# Patient Record
Sex: Male | Born: 1990 | Race: Black or African American | Hispanic: No | Smoking: Current every day smoker
Health system: Southern US, Community
[De-identification: ages and names within clinical notes are randomized; demographics above are authoritative.]

---
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
## 2008-03-28 ENCOUNTER — Emergency Department (HOSPITAL_COMMUNITY): Admission: EM | Admit: 2008-03-28 | Discharge: 2008-03-28 | Payer: Self-pay | Admitting: Emergency Medicine
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
## 2011-08-06 LAB — URINALYSIS, ROUTINE W REFLEX MICROSCOPIC
Hgb urine dipstick: NEGATIVE
Specific Gravity, Urine: 1.019
pH: 7.5

## 2011-08-06 LAB — CBC
MCHC: 33.3
MCV: 84.9
Platelets: 196
RDW: 13.9

## 2011-08-06 LAB — DIFFERENTIAL
Basophils Absolute: 0
Eosinophils Absolute: 0.1
Eosinophils Relative: 1
Lymphocytes Relative: 21
Lymphs Abs: 1.9
Monocytes Absolute: 0.9
Neutro Abs: 6.3

## 2011-08-06 LAB — URINE MICROSCOPIC-ADD ON

## 2011-08-06 LAB — HEPATIC FUNCTION PANEL
ALT: 12
AST: 18
Albumin: 4.1
Alkaline Phosphatase: 73
Bilirubin, Direct: 0.1
Total Protein: 6.6

## 2011-08-06 LAB — I-STAT 8, (EC8 V) (CONVERTED LAB)
Acid-base deficit: 1
BUN: 10
Bicarbonate: 23.3
Chloride: 107
HCT: 46
Sodium: 139

## 2015-01-04 ENCOUNTER — Encounter (HOSPITAL_COMMUNITY): Payer: Self-pay | Admitting: *Deleted

## 2015-01-04 ENCOUNTER — Emergency Department (HOSPITAL_COMMUNITY)
Admission: EM | Admit: 2015-01-04 | Discharge: 2015-01-04 | Disposition: A | Discharge status: Home / Self Care | Payer: Self-pay | Attending: Emergency Medicine | Admitting: Emergency Medicine

## 2015-01-04 DIAGNOSIS — S61219A Laceration without foreign body of unspecified finger without damage to nail, initial encounter: Secondary | ICD-10-CM

## 2015-01-04 DIAGNOSIS — W260XXA Contact with knife, initial encounter: Secondary | ICD-10-CM | POA: Insufficient documentation

## 2015-01-04 DIAGNOSIS — Y998 Other external cause status: Secondary | ICD-10-CM | POA: Insufficient documentation

## 2015-01-04 DIAGNOSIS — Y9289 Other specified places as the place of occurrence of the external cause: Secondary | ICD-10-CM | POA: Insufficient documentation

## 2015-01-04 DIAGNOSIS — Y9389 Activity, other specified: Secondary | ICD-10-CM | POA: Insufficient documentation

## 2015-01-04 DIAGNOSIS — S61215A Laceration without foreign body of left ring finger without damage to nail, initial encounter: Secondary | ICD-10-CM | POA: Insufficient documentation

## 2015-01-04 DIAGNOSIS — S61217A Laceration without foreign body of left little finger without damage to nail, initial encounter: Secondary | ICD-10-CM | POA: Insufficient documentation

## 2015-01-04 MED ORDER — TRAMADOL HCL 50 MG PO TABS: 50.0000 mg | ORAL_TABLET | Freq: Four times a day (QID) | ORAL | Status: DC | PRN

## 2015-01-04 MED ORDER — NAPROXEN 250 MG PO TABS
500.0000 mg | ORAL_TABLET | Freq: Once | ORAL | Status: AC
Start: 2015-01-04 — End: 2015-01-04
  Administered 2015-01-04: 500 mg via ORAL
  Filled 2015-01-04: qty 2

## 2015-01-04 NOTE — ED Notes (Signed)
Declined W/C at D/C and was escorted to lobby by RN. 

## 2015-01-04 NOTE — ED Notes (Signed)
Lat ring and little finger laceratiions from 1800 yesterday.   He did with a knife cutting produce

## 2015-01-04 NOTE — ED Provider Notes (Signed)
CSN: [id number]     Arrival date & time 01/04/15  1726 History  This chart was scribed for a non-physician practitioner, Eben Burowourtney A Forcucci, PA-C working with Elwin MochaBlair Walden, MD by [place] Peace, ED Scribe. The patient was seen in TR09C/TR09C. The patient's care was started at 6:27 PM.     Chief Complaint  Patient presents with  . Laceration      Patient is a 24 y.o. male presenting with skin laceration. The history is provided by the patient. No language interpreter was used.  Laceration Location:  Hand Hand laceration location: Left little finger and left ring finger. Depth:  Cutaneous Bleeding: controlled   Laceration mechanism:  Knife Pain details:    Severity:  Severe Tetanus status:  Up to date HPI Comments: Timothy Patrick is a 24 y.o. male who presents to the Emergency Department complaining of laceration to palmar aspect of left little finger and left ring finger that occurred last night while pt was cutting up vegetables. He explains that he did not come to ED last night because he wasn't in much pain but adds that when he woke up this morning, affected fingers were extremely stiff. Pt is able to move fingers but states it is difficult for him due to pain. Pt is right hand dominant. No complaints of fever, chills, nausea, or vomiting. Pt is current everyday smoker.    History reviewed. No pertinent past medical history. History reviewed. No pertinent past surgical history. No family history on file. History  Substance Use Topics  . Smoking status: Current Every Day Smoker  . Smokeless tobacco: Not on file  . Alcohol Use: Yes    Review of Systems  Constitutional: Negative for fever and chills.  Gastrointestinal: Negative for nausea and vomiting.  Skin: Positive for wound.       Laceration to left little finger and left ring finger.   All other systems reviewed and are negative.     Allergies  Review of patient's allergies indicates no known allergies.  Home  Medications   Prior to Admission medications   Medication Sig Start Date End Date Taking? Authorizing Provider  traMADol (ULTRAM) 50 MG tablet Take 1 tablet (50 mg total) by mouth every 6 (six) hours as needed. 01/04/15   Caleen Taaffe A Forcucci, PA-C   BP 134/61 mmHg  Pulse 78  Temp(Src) 98.5 F (36.9 C) (Oral)  Resp 16  SpO2 100% Physical Exam  Constitutional: He is oriented to person, place, and time. He appears well-developed and well-nourished. No distress.  HENT:  Head: Normocephalic and atraumatic.  Eyes: Conjunctivae and EOM are normal.  Neck: Neck supple. No tracheal deviation present.  Cardiovascular: Normal rate and regular rhythm.  Exam reveals no gallop and no friction rub.   No murmur heard. Pulmonary/Chest: Effort normal and breath sounds normal. No respiratory distress. He has no wheezes. He has no rales.  Musculoskeletal: Normal range of motion.       Left hand: He exhibits tenderness and laceration. He exhibits normal range of motion, no bony tenderness, normal two-point discrimination, normal capillary refill, no deformity and no swelling. Normal sensation noted. Normal strength noted.       Hands: Neurological: He is alert and oriented to person, place, and time.  Skin: Skin is warm and dry.  Psychiatric: He has a normal mood and affect. His behavior is normal.  Nursing note and vitals reviewed.   ED Course  Procedures (including critical care time) Labs Review Labs Reviewed - No  data to display  Imaging Review No results found.   EKG Interpretation None     Medications - No data to display  6:32 PM- Treatment plan was discussed with patient who verbalizes understanding and agrees.   MDM   Final diagnoses:  Finger laceration, initial encounter   Vision the 30 her old male who presents emergency room for evaluation of lacerations to the left hand. Patient's right hand dominant. Lacerations were soaked and cleaned here. Lacerations occurred more than 24  hours ago. I will not close these lacerations with sutures. Ears strips have been placed to hold them together loosely. Patient's up-to-date on his tetanus. He has full active flexion at the DIP and PIP joints. Left patient follow-up with hand surgery as needed. We'll discharge patient home with Ultram. I've instructed him to return for worsening swelling, drainage from the wound, fevers, nausea, and vomiting. Patient states understanding and agreement at this time. Patient stable for discharge.  I personally performed the services described in this documentation, which was scribed in my presence. The recorded information has been reviewed and is accurate.    Eben Burow, PA-C 01/04/15 1840  Elwin Mocha, MD 01/04/15 [phone]

## 2015-01-04 NOTE — Discharge Instructions (Signed)
Laceration Care, Adult °A laceration is a cut or lesion that goes through all layers of the skin and into the tissue just beneath the skin. °TREATMENT  °Some lacerations may not require closure. Some lacerations may not be able to be closed due to an increased risk of infection. It is important to see your caregiver as soon as possible after an injury to minimize the risk of infection and maximize the opportunity for successful closure. °If closure is appropriate, pain medicines may be given, if needed. The wound will be cleaned to help prevent infection. Your caregiver will use stitches (sutures), staples, wound glue (adhesive), or skin adhesive strips to repair the laceration. These tools bring the skin edges together to allow for faster healing and a better cosmetic outcome. However, all wounds will heal with a scar. Once the wound has healed, scarring can be minimized by covering the wound with sunscreen during the day for 1 full year. °HOME CARE INSTRUCTIONS  °For sutures or staples: °· Keep the wound clean and dry. °· If you were given a bandage (dressing), you should change it at least once a day. Also, change the dressing if it becomes wet or dirty, or as directed by your caregiver. °· Wash the wound with soap and water 2 times a day. Rinse the wound off with water to remove all soap. Pat the wound dry with a clean towel. °· After cleaning, apply a thin layer of the antibiotic ointment as recommended by your caregiver. This will help prevent infection and keep the dressing from sticking. °· You may shower as usual after the first 24 hours. Do not soak the wound in water until the sutures are removed. °· Only take over-the-counter or prescription medicines for pain, discomfort, or fever as directed by your caregiver. °· Get your sutures or staples removed as directed by your caregiver. °For skin adhesive strips: °· Keep the wound clean and dry. °· Do not get the skin adhesive strips wet. You may bathe  carefully, using caution to keep the wound dry. °· If the wound gets wet, pat it dry with a clean towel. °· Skin adhesive strips will fall off on their own. You may trim the strips as the wound heals. Do not remove skin adhesive strips that are still stuck to the wound. They will fall off in time. °For wound adhesive: °· You may briefly wet your wound in the shower or bath. Do not soak or scrub the wound. Do not swim. Avoid periods of heavy perspiration until the skin adhesive has fallen off on its own. After showering or bathing, gently pat the wound dry with a clean towel. °· Do not apply liquid medicine, cream medicine, or ointment medicine to your wound while the skin adhesive is in place. This may loosen the film before your wound is healed. °· If a dressing is placed over the wound, be careful not to apply tape directly over the skin adhesive. This may cause the adhesive to be pulled off before the wound is healed. °· Avoid prolonged exposure to sunlight or tanning lamps while the skin adhesive is in place. Exposure to ultraviolet light in the first year will darken the scar. °· The skin adhesive will usually remain in place for 5 to 10 days, then naturally fall off the skin. Do not pick at the adhesive film. °You may need a tetanus shot if: °· You cannot remember when you had your last tetanus shot. °· You have never had a tetanus   shot. °If you get a tetanus shot, your arm may swell, get red, and feel warm to the touch. This is common and not a problem. If you need a tetanus shot and you choose not to have one, there is a rare chance of getting tetanus. Sickness from tetanus can be serious. °SEEK MEDICAL CARE IF:  °· You have redness, swelling, or increasing pain in the wound. °· You see a red line that goes away from the wound. °· You have yellowish-white fluid (pus) coming from the wound. °· You have a fever. °· You notice a bad smell coming from the wound or dressing. °· Your wound breaks open before or  after sutures have been removed. °· You notice something coming out of the wound such as wood or glass. °· Your wound is on your hand or foot and you cannot move a finger or toe. °SEEK IMMEDIATE MEDICAL CARE IF:  °· Your pain is not controlled with prescribed medicine. °· You have severe swelling around the wound causing pain and numbness or a change in color in your arm, hand, leg, or foot. °· Your wound splits open and starts bleeding. °· You have worsening numbness, weakness, or loss of function of any joint around or beyond the wound. °· You develop painful lumps near the wound or on the skin anywhere on your body. °MAKE SURE YOU:  °· Understand these instructions. °· Will watch your condition. °· Will get help right away if you are not doing well or get worse. °Document Released: 11/01/2005 Document Revised: 01/24/2012 Document Reviewed: 04/27/2011 °ExitCare® Patient Information ©2015 [employer]. This information is not intended to replace advice given to you by your health care provider. Make sure you discuss any questions you have with your health care provider. ° ° °Emergency Department Resource Guide °1) Find a Doctor and Pay Out of Pocket °Although you won't have to find out who is covered by your insurance plan, it is a good idea to ask around and get recommendations. You will then need to call the office and see if the doctor you have chosen will accept you as a new patient and what types of options they offer for patients who are self-pay. Some doctors offer discounts or will set up payment plans for their patients who do not have insurance, but you will need to ask so you aren't surprised when you get to your appointment. ° °2) Contact Your Local Health Department °Not all health departments have doctors that can see patients for sick visits, but many do, so it is worth a call to see if yours does. If you don't know where your local health department is, you can check in your phone book. The CDC  also has a tool to help you locate your state's health department, and many state websites also have listings of all of their local health departments. ° °3) Find a Walk-in Clinic °If your illness is not likely to be very severe or complicated, you may want to try a walk in clinic. These are popping up all over the country in pharmacies, drugstores, and shopping centers. They're usually staffed by nurse practitioners or physician assistants that have been trained to treat common illnesses and complaints. They're usually fairly quick and inexpensive. However, if you have serious medical issues or chronic medical problems, these are probably not your best option. ° °No Primary Care Doctor: °- Call [facility] at  832-8000 - they can help you locate a primary care doctor   that  accepts your insurance, provides certain services, etc. °- Physician Referral Service- 1-800-533-3463 ° °Chronic Pain Problems: °Organization         Address  Phone   Notes  °[facility]  (336) 297-2271 Patients need to be referred by their primary care doctor.  ° °Medication Assistance: °Organization         Address  Phone   Notes  °[place] [facility] [address] °[place], [place] [address] (336) 641-8030 --Must be a resident of [place] °-- Must have NO insurance coverage whatsoever (no Medicaid/ Medicare, etc.) °-- The pt. MUST have a primary care doctor that directs their care regularly and follows them in the community °  °[facility]  (866) 331-1348   °[employer]  (888) 892-1162   ° °Agencies that provide inexpensive medical care: °Organization         Address  Phone   Notes  °[facility]  (336) 832-8035   °[facility]    (336) 832-7272   °[facility] [address] °[place], [place] [address] (336) 832-4777   °[facility] [address], °[place] (336) 271-4999   °[facility]    (336)  373-0678   °[facility]    (336) 272-1050   °[facility] ° [address], [place] Phone:  (336) 832-4444, Fax:  (336) 832-4440 Hours of Operation:  9 am - 6 pm, M-F.  Also accepts Medicaid/Medicare and self-pay.  °[facility] ° [address], [place] Phone: (336) [phone], Fax: (336) [phone]. Hours of Operation:  8:30 am - 5:30 pm, M-F.  Also accepts Medicaid and self-pay.  °[facility] [place] [address], [place] Phone: (336) 878-6027   °[facility] [address], [place], [place] (336)723-1848, Ext. 123 Mondays & Thursdays: 7-9 AM.  First 15 patients are seen on a first come, first serve basis. °  ° °Medicaid-accepting [place] Providers: ° °Organization         Address  Phone   Notes  °[facility] [address], [place] (336) 641-2100 Also accepts self-pay patients.  °[facility] [address], [place] ° (336) 856-9996   °[facility] [address], [place] (336) 288-8857   °[facility] [address] [place] [address], [place] (336) 299-7000   °Veita Bland [address], [place]  ° (336) 373-1557 Only accepts [place] Access Medicaid patients after they have their name applied to their card.  ° °Self-Pay (no insurance) in [place]: ° °Organization         Address  Phone   Notes  °Sickle Cell Patients, [facility] [address], [place] (336) 832-1970   °[facility] [address], [place] (336) 832-4400   °[facility] ° [address], [place] (336) 992-4800   °[facility]/Dr. Osei-Bonsu ° [address] [place] [address], [place] or [address], [place] (336) 841-8500 Phone number for both [place] and [place] locations is the same.  °[facility] [address], [place] (336) 299-0000   °[facility] [address] [place] [address], [place] or [address] (336) 852-7530 °(336) 878-2260   °  [facility] [address], [place] (336) 350-1642, phone; (336) 294-5005, fax Sees patients 1st and 3rd Saturday of every month.  Must not qualify for public or private insurance (i.e. Medicaid, Medicare, [place] Health Choice, Veterans' Benefits) • Household income should be no more than 200% of the poverty level •The clinic cannot treat you if you are pregnant or think you are pregnant • Sexually transmitted diseases are not treated at the clinic.  ° ° °Dental Care: °Organization         Address  Phone  Notes  °[facility] [address], [place] (336) 641-6152 Accepts children up to age 21 who are enrolled in Medicaid or [place] Health Choice; pregnant women with a Medicaid card; and children who have applied for Medicaid or [place] Health Choice, but were declined, whose parents can pay a reduced fee at time of service.  °[facility] [place]  [address], [place] (336) 641-7733 Accepts children up to age 21 who are enrolled in Medicaid or [place] Health Choice; pregnant women with a Medicaid card; and children who have applied for Medicaid or [place] Health Choice, but were declined, whose parents can pay a reduced fee at time of service.  °[facility] ° [address], [place] (336) 641-4533 Patients are seen by appointment only. Walk-ins are not accepted. [facility] will see patients 18 years of age and older. °Monday - Tuesday (8am-5pm) °Most Wednesdays (8:30-5pm) °$30 per visit, cash only  °[facility] ° [address], [place] (336) 641-4533 Patients are seen by appointment only. Walk-ins are not accepted. [facility] will see patients 18 years of age and older. °One  Wednesday Evening (Monthly: Volunteer Based).  $30 per visit, cash only  °[facility]  (919) 537-3737 for adults; Children under age 4, call Graduate Pediatric Dentistry at ([phone]) [phone]. Children aged 4-14, please call ([phone]) [phone] to request a pediatric application. ° Dental services are provided in all areas of dental care including fillings, crowns and bridges, complete and partial dentures, implants, gum treatment, root canals, and extractions. Preventive care is also provided. Treatment is provided to both adults and children. °Patients are selected via a lottery and there is often a waiting list. °  °[facility] [address], °[place] ° (336) 763-8833 [email or web address] °  °[facility] [address], [place], [place] (336)723-1848, Ext. 123 Second and Fourth Thursday of each month, opens at 6:30 AM; Clinic ends at 9 AM.  Patients are seen on a first-come first-served basis, and a limited number are seen during each clinic.  ° °[facility] ° [address], [place], [place] (336) 723-7904   Eligibility Requirements °You must have lived in [place], [place], or [place] counties for at least the last three months. °  You cannot be eligible for state or federal sponsored healthcare insurance, including Veterans Administration, Medicaid, or Medicare. °  You generally cannot be eligible for healthcare insurance through your employer.  °  How to apply: °Eligibility screenings are held every Tuesday and Wednesday afternoon from 1:00 pm until 4:00 pm. You do not need an appointment for the interview!  °[facility] [address], [place],  336-631-2330   °[facility]  336-342-8273   °[facility]  336-703-3100   °[facility]    336-570-6415   ° °Behavioral Health Resources in the Community: °Intensive Outpatient Programs °Organization          Address  Phone  Notes  °[facility] [address], [place], [place] 336-878-6098   °[facility] [address], [place], [place] 336-832-9800   °[facility] [address], [place], [place] ° 336-882-2125   °[facility] [address],  °[place], [place] 1-800-853-5163 or 336-641-4981   °Substance Abuse Resources °Organization         Address  Phone  Notes  °[facility]  336-882-2125   °[facility]  336-784-9470   °[facility]  336-285-9073   °[facility]  336-845-3988   °Residential & Outpatient Substance Abuse Program  1-800-659-3381   °Psychological Services °Organization         Address  Phone  Notes  °[facility]  336- 832-9600   °[facility]  336- 378-7881   °[facility] [address], [place] 1-800-853-5163 or 336-641-4981   ° °Mobile Crisis Teams °Organization         Address  Phone  Notes  °[facility], Mobile Crisis Care Unit  1-877-626-1772   °Assertive °[facility] ° [address] [place], [place] 336-834-9664   °Sharon DeEsch [address] °[place] 336-554-5454   ° °Self-Help/Support Groups °Organization         Address  Phone             Notes  °[facility] - variety of support groups  336- 373-1402 Call for more information  °Narcotics Anonymous (NA), [facility] [address], °[place]  2 meetings at this location  ° °Residential Treatment Programs °Organization         Address  Phone  Notes  °[facility] [address],    °[place]  1-866-801-8205   °[facility] ° [address], [place], [place] 704-293-8524   °[facility] [address], [place] 336-845-3988 Admissions: 8am-3pm M-F  °[facility] [address]    °[place], [place] 336-841-1104   °[facility] [address], [place], [place] 336-379-7146   °[facility] [address]  °[place], [place] 336-285-9073   °[facility] - Intensive Outpatient [address], [place], [place] 336-852-3033   °[facility] [address]  °[place], [place] 1-877-615-2722 or 336-784-9470   °[facility] [address] [place], [place] 336-227-7417 Accepts Medicaid  °[facility] [address]  °[place] 1-800-659-3381 Substance Abuse/Addiction Treatment  ° °[place] Behavioral Health Resources °Organization         Address  Phone  Notes  °[facility]  (888) 581-9988   °Julie Brannon, PhD [address] [place], [place]   (336) 349-5553 or (336) 951-0000   ° Behavioral   [address] °[place],  (336) 349-4454   °[facility] [address], [place],  (336) 342-8316 Insurance/Medicaid/sponsorship through [facility]  °[facility] [address]                                    [place],  (336) 342-8316 Therapy/tele-psych/case  °[facility] [address]  ° [place], [place] (336) 349-2233    °Dr. Arfeen  (336) 349-4544   °[facility] [place]  [employer] [place] [facility] 1) [address], [place] °2) [address], [place] °3)  [address], [place] (336) 349-3220 °(336) 342-7768 ° °(336) 342-8140   °[place] [facility] (336) 342-1394 or (336) 342-3537 (After Hours)    ° ° ° °

## 2015-05-05 ENCOUNTER — Emergency Department (INDEPENDENT_AMBULATORY_CARE_PROVIDER_SITE_OTHER)
Admission: EM | Admit: 2015-05-05 | Discharge: 2015-05-05 | Disposition: A | Discharge status: Home / Self Care | Payer: Self-pay | Source: Home / Self Care | Attending: Family Medicine | Admitting: Family Medicine

## 2015-05-05 ENCOUNTER — Emergency Department (INDEPENDENT_AMBULATORY_CARE_PROVIDER_SITE_OTHER): Payer: Self-pay

## 2015-05-05 ENCOUNTER — Encounter (HOSPITAL_COMMUNITY): Payer: Self-pay | Admitting: Emergency Medicine

## 2015-05-05 DIAGNOSIS — S93401A Sprain of unspecified ligament of right ankle, initial encounter: Secondary | ICD-10-CM

## 2015-05-05 MED ORDER — NAPROXEN 500 MG PO TABS: 500.0000 mg | ORAL_TABLET | Freq: Two times a day (BID) | ORAL | Status: DC

## 2015-05-05 NOTE — Discharge Instructions (Signed)
Thank you for coming in today. It looks like you sprained your ankle. There are evidence of old ankle injuries from previous ankle sprains. When you are able to please follow-up with orthopedics   Acute Ankle Sprain with Phase I Rehab An acute ankle sprain is a partial or complete tear in one or more of the ligaments of the ankle due to traumatic injury. The severity of the injury depends on both the number of ligaments sprained and the grade of sprain. There are 3 grades of sprains.   A grade 1 sprain is a mild sprain. There is a slight pull without obvious tearing. There is no loss of strength, and the muscle and ligament are the correct length.  A grade 2 sprain is a moderate sprain. There is tearing of fibers within the substance of the ligament where it connects two bones or two cartilages. The length of the ligament is increased, and there is usually decreased strength.  A grade 3 sprain is a complete rupture of the ligament and is uncommon. In addition to the grade of sprain, there are three types of ankle sprains.  Lateral ankle sprains: This is a sprain of one or more of the three ligaments on the outer side (lateral) of the ankle. These are the most common sprains. Medial ankle sprains: There is one large triangular ligament of the inner side (medial) of the ankle that is susceptible to injury. Medial ankle sprains are less common. Syndesmosis, "high ankle," sprains: The syndesmosis is the ligament that connects the two bones of the lower leg. Syndesmosis sprains usually only occur with very severe ankle sprains. SYMPTOMS  Pain, tenderness, and swelling in the ankle, starting at the side of injury that may progress to the whole ankle and foot with time.  "Pop" or tearing sensation at the time of injury.  Bruising that may spread to the heel.  Impaired ability to walk soon after injury. CAUSES   Acute ankle sprains are caused by trauma placed on the ankle that temporarily forces  or pries the anklebone (talus) out of its normal socket.  Stretching or tearing of the ligaments that normally hold the joint in place (usually due to a twisting injury). RISK INCREASES WITH:  Previous ankle sprain.  Sports in which the foot may land awkwardly (i.e., basketball, volleyball, or soccer) or walking or running on uneven or rough surfaces.  Shoes with inadequate support to prevent sideways motion when stress occurs.  Poor strength and flexibility.  Poor balance skills.  Contact sports. PREVENTION   Warm up and stretch properly before activity.  Maintain physical fitness:  Ankle and leg flexibility, muscle strength, and endurance.  Cardiovascular fitness.  Balance training activities.  Use proper technique and have a coach correct improper technique.  Taping, protective strapping, bracing, or high-top tennis shoes may help prevent injury. Initially, tape is best; however, it loses most of its support function within 10 to 15 minutes.  Wear proper-fitted protective shoes (High-top shoes with taping or bracing is more effective than either alone).  Provide the ankle with support during sports and practice activities for 12 months following injury. PROGNOSIS   If treated properly, ankle sprains can be expected to recover completely; however, the length of recovery depends on the degree of injury.  A grade 1 sprain usually heals enough in 5 to 7 days to allow modified activity and requires an average of 6 weeks to heal completely.  A grade 2 sprain requires 6 to 10 weeks to  heal completely.  A grade 3 sprain requires 12 to 16 weeks to heal.  A syndesmosis sprain often takes more than 3 months to heal. RELATED COMPLICATIONS   Frequent recurrence of symptoms may result in a chronic problem. Appropriately addressing the problem the first time decreases the frequency of recurrence and optimizes healing time. Severity of the initial sprain does not predict the  likelihood of later instability.  Injury to other structures (bone, cartilage, or tendon).  A chronically unstable or arthritic ankle joint is a possibility with repeated sprains. TREATMENT Treatment initially involves the use of ice, medication, and compression bandages to help reduce pain and inflammation. Ankle sprains are usually immobilized in a walking cast or boot to allow for healing. Crutches may be recommended to reduce pressure on the injury. After immobilization, strengthening and stretching exercises may be necessary to regain strength and a full range of motion. Surgery is rarely needed to treat ankle sprains. MEDICATION   Nonsteroidal anti-inflammatory medications, such as aspirin and ibuprofen (do not take for the first 3 days after injury or within 7 days before surgery), or other minor pain relievers, such as acetaminophen, are often recommended. Take these as directed by your caregiver. Contact your caregiver immediately if any bleeding, stomach upset, or signs of an allergic reaction occur from these medications.  Ointments applied to the skin may be helpful.  Pain relievers may be prescribed as necessary by your caregiver. Do not take prescription pain medication for longer than 4 to 7 days. Use only as directed and only as much as you need. HEAT AND COLD  Cold treatment (icing) is used to relieve pain and reduce inflammation for acute and chronic cases. Cold should be applied for 10 to 15 minutes every 2 to 3 hours for inflammation and pain and immediately after any activity that aggravates your symptoms. Use ice packs or an ice massage.  Heat treatment may be used before performing stretching and strengthening activities prescribed by your caregiver. Use a heat pack or a warm soak. SEEK IMMEDIATE MEDICAL CARE IF:   Pain, swelling, or bruising worsens despite treatment.  You experience pain, numbness, discoloration, or coldness in the foot or toes.  New, unexplained  symptoms develop (drugs used in treatment may produce side effects.) EXERCISES  PHASE I EXERCISES RANGE OF MOTION (ROM) AND STRETCHING EXERCISES - Ankle Sprain, Acute Phase I, Weeks 1 to 2 These exercises may help you when beginning to restore flexibility in your ankle. You will likely work on these exercises for the 1 to 2 weeks after your injury. Once your physician, physical therapist, or athletic trainer sees adequate progress, he or she will advance your exercises. While completing these exercises, remember:   Restoring tissue flexibility helps normal motion to return to the joints. This allows healthier, less painful movement and activity.  An effective stretch should be held for at least 30 seconds.  A stretch should never be painful. You should only feel a gentle lengthening or release in the stretched tissue. RANGE OF MOTION - Dorsi/Plantar Flexion  While sitting with your right / left knee straight, draw the top of your foot upwards by flexing your ankle. Then reverse the motion, pointing your toes downward.  Hold each position for __________ seconds.  After completing your first set of exercises, repeat this exercise with your knee bent. Repeat __________ times. Complete this exercise __________ times per day.  RANGE OF MOTION - Ankle Alphabet  Imagine your right / left big toe is a  pen.  Keeping your hip and knee still, write out the entire alphabet with your "pen." Make the letters as large as you can without increasing any discomfort. Repeat __________ times. Complete this exercise __________ times per day.  STRENGTHENING EXERCISES - Ankle Sprain, Acute -Phase I, Weeks 1 to 2 These exercises may help you when beginning to restore strength in your ankle. You will likely work on these exercises for 1 to 2 weeks after your injury. Once your physician, physical therapist, or athletic trainer sees adequate progress, he or she will advance your exercises. While completing these  exercises, remember:   Muscles can gain both the endurance and the strength needed for everyday activities through controlled exercises.  Complete these exercises as instructed by your physician, physical therapist, or athletic trainer. Progress the resistance and repetitions only as guided.  You may experience muscle soreness or fatigue, but the pain or discomfort you are trying to eliminate should never worsen during these exercises. If this pain does worsen, stop and make certain you are following the directions exactly. If the pain is still present after adjustments, discontinue the exercise until you can discuss the trouble with your clinician. STRENGTH - Dorsiflexors  Secure a rubber exercise band/tubing to a fixed object (i.e., table, pole) and loop the other end around your right / left foot.  Sit on the floor facing the fixed object. The band/tubing should be slightly tense when your foot is relaxed.  Slowly draw your foot back toward you using your ankle and toes.  Hold this position for __________ seconds. Slowly release the tension in the band and return your foot to the starting position. Repeat __________ times. Complete this exercise __________ times per day.  STRENGTH - Plantar-flexors   Sit with your right / left leg extended. Holding onto both ends of a rubber exercise band/tubing, loop it around the ball of your foot. Keep a slight tension in the band.  Slowly push your toes away from you, pointing them downward.  Hold this position for __________ seconds. Return slowly, controlling the tension in the band/tubing. Repeat __________ times. Complete this exercise __________ times per day.  STRENGTH - Ankle Eversion  Secure one end of a rubber exercise band/tubing to a fixed object (table, pole). Loop the other end around your foot just before your toes.  Place your fists between your knees. This will focus your strengthening at your ankle.  Drawing the band/tubing  across your opposite foot, slowly, pull your little toe out and up. Make sure the band/tubing is positioned to resist the entire motion.  Hold this position for __________ seconds. Have your muscles resist the band/tubing as it slowly pulls your foot back to the starting position.  Repeat __________ times. Complete this exercise __________ times per day.  STRENGTH - Ankle Inversion  Secure one end of a rubber exercise band/tubing to a fixed object (table, pole). Loop the other end around your foot just before your toes.  Place your fists between your knees. This will focus your strengthening at your ankle.  Slowly, pull your big toe up and in, making sure the band/tubing is positioned to resist the entire motion.  Hold this position for __________ seconds.  Have your muscles resist the band/tubing as it slowly pulls your foot back to the starting position. Repeat __________ times. Complete this exercises __________ times per day.  STRENGTH - Towel Curls  Sit in a chair positioned on a non-carpeted surface.  Place your right / left foot  on a towel, keeping your heel on the floor.  Pull the towel toward your heel by only curling your toes. Keep your heel on the floor.  If instructed by your physician, physical therapist, or athletic trainer, add weight to the end of the towel. Repeat __________ times. Complete this exercise __________ times per day. Document Released: 06/02/2005 Document Revised: 03/18/2014 Document Reviewed: 02/13/2009 [facility] Patient Information 2015 [place], [place]. This information is not intended to replace advice given to you by your health care provider. Make sure you discuss any questions you have with your health care provider.  Acute Ankle Sprain with Phase II Rehab An acute ankle sprain is a partial or complete tear in one or more of the ligaments of the ankle due to traumatic injury. The severity of the injury depends on both the number of ligaments  sprained and the grade of sprain. There are 3 grades of sprains.  A grade 1 sprain is a mild sprain. There is a slight pull without obvious tearing. There is no loss of strength, and the muscle and ligament are the correct length.  A grade 2 sprain is a moderate sprain. There is tearing of fibers within the substance of the ligament where it connects two bones or two cartilages. The length of the ligament is increased, and there is usually decreased strength.  A grade 3 sprain is a complete rupture of the ligament and is uncommon. In addition to the grade of sprain, there are 3 types of ankle sprains.  Lateral ankle sprains. This is a sprain of one or more of the 3 ligaments on the outer side (lateral) of the ankle. These are the most common sprains. Medial ankle sprains. There is one large triangular ligament on the inner side (medial) of the ankle that is susceptible to injury. Medial ankle sprains are less common. Syndesmosis, "high ankle," sprains. The syndesmosis is the ligament that connects the two bones of the lower leg. Syndesmosis sprains usually only occur with very severe ankle sprains. SYMPTOMS  Pain, tenderness, and swelling in the ankle, starting at the side of injury that may progress to the whole ankle and foot with time.  "Pop" or tearing sensation at the time of injury.  Bruising that may spread to the heel.  Impaired ability to walk soon after injury. CAUSES   Acute ankle sprains are caused by trauma placed on the ankle that temporarily forces or pries the anklebone (talus) out of its normal socket.  Stretching or tearing of the ligaments that normally hold the joint in place (usually due to a twisting injury). RISK INCREASES WITH:  Previous ankle sprain.  Sports in which the foot may land awkwardly (basketball, volleyball, soccer) or walking or running on uneven or rough surfaces.  Shoes with inadequate support to prevent sideways motion when stress occurs.  Poor  strength and flexibility.  Poor balance skills.  Contact sports. PREVENTION  Warm up and stretch properly before activity.  Maintain physical fitness:  Ankle and leg flexibility, muscle strength, and endurance.  Cardiovascular fitness.  Balance training activities.  Use proper technique and have a coach correct improper technique.  Taping, protective strapping, bracing, or high-top tennis shoes may help prevent injury. Initially, tape is best. However, it loses most of its support function within 10 to 15 minutes.  Wear proper fitted protective shoes. Combining high-top shoes with taping or bracing is more effective than using either alone.  Provide the ankle with support during sports and practice activities for 12  months following injury. PROGNOSIS   If treated properly, ankle sprains can be expected to recover completely. However, the length of recovery depends on the degree of injury.  A grade 1 sprain usually heals enough in 5 to 7 days to allow modified activity and requires an average of 6 weeks to heal completely.  A grade 2 sprain requires 6 to 10 weeks to heal completely.  A grade 3 sprain requires 12 to 16 weeks to heal.  A syndesmosis sprain often takes more than 3 months to heal. RELATED COMPLICATIONS   Frequent recurrence of symptoms may result in a chronic problem. Appropriately addressing the problem the first time decreases the frequency of recurrence and optimizes healing time. Severity of initial sprain does not predict the likelihood of later instability.  Injury to other structures (bone, cartilage, or tendon).  Chronically unstable or arthritic ankle joint are possible with repeated sprains. TREATMENT Treatment initially involves the use of ice, medicine, and compression bandages to help reduce pain and inflammation. Ankle sprains are usually immobilized in a walking cast or boot to allow for healing. Crutches may be recommended to reduce pressure on  the injury. After immobilization, strengthening and stretching exercises may be necessary to regain strength and a full range of motion. Surgery is rarely needed to treat ankle sprains. MEDICATION   Nonsteroidal anti-inflammatory medicines, such as aspirin and ibuprofen (do not take for the first 3 days after injury or within 7 days before surgery), or other minor pain relievers, such as acetaminophen, are often recommended. Take these as directed by your caregiver. Contact your caregiver immediately if any bleeding, stomach upset, or signs of an allergic reaction occur from these medicines.  Ointments applied to the skin may be helpful.  Pain relievers may be prescribed as necessary by your caregiver. Do not take prescription pain medicine for longer than 4 to 7 days. Use only as directed and only as much as you need. HEAT AND COLD  Cold treatment (icing) is used to relieve pain and reduce inflammation for acute and chronic cases. Cold should be applied for 10 to 15 minutes every 2 to 3 hours for inflammation and pain and immediately after any activity that aggravates your symptoms. Use ice packs or an ice massage.  Heat treatment may be used before performing stretching and strengthening activities prescribed by your caregiver. Use a heat pack or a warm soak. SEEK IMMEDIATE MEDICAL CARE IF:   Pain, swelling, or bruising worsens despite treatment.  You experience pain, numbness, discoloration, or coldness in the foot or toes.  New, unexplained symptoms develop. (Drugs used in treatment may produce side effects.) EXERCISES  PHASE II EXERCISES RANGE OF MOTION (ROM) AND STRETCHING EXERCISES - Ankle Sprain, Acute-Phase II, Weeks 3 to 4 After your physician, physical therapist, or athletic trainer feels your knee has made progress significant enough to begin more advanced exercises, he or she may recommend completing some of the following exercises. Although each person heals at different rates,  most people will be ready for these exercises between 3 and 4 weeks after their injury. Do not begin these exercises until you have your caregiver's permission. He or she may also advise you to continue with the exercises which you completed in Phase I of your rehabilitation. While completing these exercises, remember:   Restoring tissue flexibility helps normal motion to return to the joints. This allows healthier, less painful movement and activity.  An effective stretch should be held for at least 30 seconds.  A  stretch should never be painful. You should only feel a gentle lengthening or release in the stretched tissue. RANGE OF MOTION - Ankle Plantar Flexion   Sit with your right / left leg crossed over your opposite knee.  Use your opposite hand to pull the top of your foot and toes toward you.  You should feel a gentle stretch on the top of your foot/ankle. Hold this position for __________. Repeat __________ times. Complete __________ times per day.  RANGE OF MOTION - Ankle Eversion  Sit with your right / left ankle crossed over your opposite knee.  Grip your foot with your opposite hand, placing your thumb on the top of your foot and your fingers across the bottom of your foot.  Gently push your foot downward with a slight rotation so your littlest toes rise slightly  You should feel a gentle stretch on the inside of your ankle. Hold the stretch for __________ seconds. Repeat __________ times. Complete this exercise __________ times per day.  RANGE OF MOTION - Ankle Inversion  Sit with your right / left ankle crossed over your opposite knee.  Grip your foot with your opposite hand, placing your thumb on the bottom of your foot and your fingers across the top of your foot.  Gently pull your foot so the smallest toe comes toward you and your thumb pushes the inside of the ball of your foot away from you.  You should feel a gentle stretch on the outside of your ankle. Hold the  stretch for __________ seconds. Repeat __________ times. Complete this exercise __________ times per day.  STRETCH - Gastrocsoleus  Sit with your right / left leg extended. Holding onto both ends of a belt or towel, loop it around the ball of your foot.  Keeping your right / left ankle and foot relaxed and your knee straight, pull your foot and ankle toward you using the belt/towel.  You should feel a gentle stretch behind your calf or knee. Hold this position for __________ seconds. Repeat __________ times. Complete this stretch __________ times per day.  RANGE OF MOTION - Ankle Dorsiflexion, Active Assisted  Remove shoes and sit on a chair that is preferably not on a carpeted surface.  Place right / left foot under knee. Extend your opposite leg for support.  Keeping your heel down, slide your right / left foot back toward the chair until you feel a stretch at your ankle or calf. If you do not feel a stretch, slide your bottom forward to the edge of the chair while still keeping your heel down.  Hold this stretch for __________ seconds. Repeat __________ times. Complete this stretch __________ times per day.  STRETCH - Gastroc, Standing   Place hands on wall.  Extend right / left leg and place a folded washcloth under the arch of your foot for support. Keep the front knee somewhat bent.  Slightly point your toes inward on your back foot.  Keeping your right / left heel on the floor and your knee straight, shift your weight toward the wall, not allowing your back to arch.  You should feel a gentle stretch in the calf. Hold this position for __________ seconds. Repeat __________ times. Complete this stretch __________ times per day. STRETCH - Soleus, Standing  Place hands on wall.  Extend right / left leg and place a folded washcloth under the arch of your foot for support. Keep the front knee somewhat bent.  Slightly point your toes inward on  your back foot.  Keep your right  / left heel on the floor, bend your back knee, and slightly shift your weight over the back leg so that you feel a gentle stretch deep in your back calf.  Hold this position for __________ seconds. Repeat __________ times. Complete this stretch __________ times per day. STRETCH - Gastrocsoleus, Standing Note: This exercise can place a lot of stress on your foot and ankle. Please complete this exercise only if specifically instructed by your caregiver.   Place the ball of your right / left foot on a step, keeping your other foot firmly on the same step.  Hold on to the wall or a rail for balance.  Slowly lift your other foot, allowing your body weight to press your heel down over the edge of the step.  You should feel a stretch in your right / left calf.  Hold this position for __________ seconds.  Repeat this exercise with a slight bend in your knee. Repeat __________ times. Complete this stretch __________ times per day.  STRENGTHENING EXERCISES - Ankle Sprain, Acute-Phase II Around 3 to 4 weeks after your injury, you may progress to some of these exercises in your rehabilitation program. Do not begin these until you have your caregiver's permission. Although your condition has improved, the Phase I exercises will continue to be helpful and you may continue to complete them. As you complete strengthening exercises, remember:   Strong muscles with good endurance tolerate stress better.  Do the exercises as initially prescribed by your caregiver. Progress slowly with each exercise, gradually increasing the number of repetitions and weight used under his or her guidance.  You may experience muscle soreness or fatigue, but the pain or discomfort you are trying to eliminate should never worsen during these exercises. If this pain does worsen, stop and make certain you are following the directions exactly. If the pain is still present after adjustments, discontinue the exercise until you can  discuss the trouble with your caregiver. STRENGTH - Plantar-flexors, Standing  Stand with your feet shoulder width apart. Steady yourself with a wall or table using as little support as needed.  Keeping your weight evenly spread over the width of your feet, rise up on your toes.*  Hold this position for __________ seconds. Repeat __________ times. Complete this exercise __________ times per day.  *If this is too easy, shift your weight toward your right / left leg until you feel challenged. Ultimately, you may be asked to do this exercise with your right / left foot only. STRENGTH - Dorsiflexors and Plantar-flexors, Heel/toe Walking  Dorsiflexion: Walk on your heels only. Keep your toes as high as possible.  Walk for ____________________ seconds/feet.  Repeat __________ times. Complete __________ times per day.  Plantar flexion: Walk on your toes only. Keep your heels as high as possible.  Walk for ____________________ seconds/feet. Repeat __________ times. Complete __________ times per day.  BALANCE - Tandem Walking  Place your uninjured foot on a line 2 to 4 inches wide and at least 10 feet long.  Keeping your balance without using anything for extra support, place your right / left heel directly in front of your other foot.  Slowly raise your back foot up, lifting from the heel to the toes, and place it directly in front of the right / left foot.  Continue to walk along the line slowly. Walk for ____________________ feet. Repeat ____________________ times. Complete ____________________ times per day. BALANCE - Inversion/Eversion Use caution, these  are advanced level exercises. Do not begin them until you are advised to do so.   Create a balance board using a sturdy board about 1  feet long and at 1 to 1  feet wide and a 1  inch diameter rod or pipe that is as long as the board's width. A copper pipe or a solid broomstick work well.  Stand on a non-carpeted surface near a  countertop or wall. Step onto the board so that your feet are hip-width apart and equally straddle the rod/pipe.  Keeping your feet in place, complete these two exercises without shifting your upper body or hips:  Tip the board from side-to-side. Control the movement so the board does not forcefully strike the ground. The board should silently tap the ground.  Tip the board side-to-side without striking the ground. Occasionally pause and maintain a steady position at various points.  Repeat the first two exercises, but use only your right / left foot. Place your right / left foot directly over the rod/pipe. Repeat __________ times. Complete this exercise __________ times a day. BALANCE - Plantar/Dorsi Flexion Use caution, these are advanced level exercises. Do not begin them until you are advised to do so.   Create a balance board using a sturdy board about 1  feet long and at 1 to 1  feet wide and a 1  inch diameter rod or pipe that is as long as the board's width. A copper pipe or a solid broomstick work well.  Stand on a non-carpeted surface near a countertop or wall. Stand on the board so that the rod/pipe runs under the arches in your feet.  Keeping your feet in place, complete these two exercises without shifting your upper body or hips:  Tip the board from side-to-side. Control the movement so the board does not forcefully strike the ground. The board should silently tap the ground.  Tip the board side-to-side without striking the ground. Occasionally pause and maintain a steady position at various points.  Repeat the first two exercises, but use only your right / left foot. Stand in the center of the board. Repeat __________ times. Complete this exercise __________ times a day. STRENGTH - Plantar-flexors, Eccentric Note: This exercise can place a lot of stress on your foot and ankle. Please complete this exercise only if specifically instructed by your caregiver.   Place the  balls of your feet on a step. With your hands, use only enough support from a wall or rail to keep your balance.  Keep your knees straight and rise up on your toes.  Slowly shift your weight entirely to your toes and pick up your opposite foot. Gently and with controlled movement, lower your weight through your right / left foot so that your heel drops below the level of the step. You will feel a slight stretch in the back of your calf at the ending position.  Use the healthy leg to help rise up onto the balls of both feet, then lower weight only on the right / left leg again. Build up to 15 repetitions. Then progress to 3 consecutive sets of 15 repetitions.*  After completing the above exercise, complete the same exercise with a slight knee bend (about 30 degrees). Again, build up to 15 repetitions. Then progress to 3 consecutive sets of 15 repetitions.* Perform this exercise __________ times per day.  *When you easily complete 3 sets of 15, your physician, physical therapist, or athletic trainer may advise you to  add resistance by wearing a backpack filled with additional weight. Document Released: 02/21/2006 Document Revised: 01/24/2012 Document Reviewed: 02/13/2009 [facility] Patient Information 2015 [place], [place]. This information is not intended to replace advice given to you by your health care provider. Make sure you discuss any questions you have with your health care provider.

## 2015-05-05 NOTE — ED Notes (Signed)
Pt was running with his dog and twisted his right ankle.  Pt states he is a 9/10 pain and is having trouble putting weight on it.

## 2015-05-05 NOTE — ED Provider Notes (Signed)
Timothy Patrick is a 24 y.o. male who presents to Urgent Care today for right ankle injury. Patient suffered an inversion injury to his right ankle today walking his dog. He notes pain and swelling at the lateral ankle. Pain is worse with ambulation. No radiating pain weakness or numbness. Pain is severe. No treatment tried yet.   History reviewed. No pertinent past medical history. History reviewed. No pertinent past surgical history. History  Substance Use Topics  . Smoking status: Current Every Day Smoker  . Smokeless tobacco: Not on file  . Alcohol Use: Yes   ROS as above Medications: No current facility-administered medications for this encounter.   Current Outpatient Prescriptions  Medication Sig Dispense Refill  . naproxen (NAPROSYN) 500 MG tablet Take 1 tablet (500 mg total) by mouth 2 (two) times daily. 30 tablet 0   No Known Allergies   Exam:  BP 113/65 mmHg  Pulse 69  Temp(Src) 99.5 F (37.5 C) (Oral)  Resp 16  SpO2 98% Gen: Well NAD HEENT: EOMI,  MMM Right leg:  Knee normal-appearing nontender normal motion Right ankle: Swollen and tender lateral malleolus. Pain with motion. capillary refill and sensation intact Foot normal-appearing nontender normal pulses motion and capillary refill Exts: Brisk capillary refill, warm and well perfused.   No results found for this or any previous visit (from the past 24 hour(s)). Dg Ankle Complete Right  05/05/2015   CLINICAL DATA:  Inversion injury. Ankle pain. Injury today. Pain and swelling on the lateral aspect of the ankle.  EXAM: RIGHT ANKLE - COMPLETE 3+ VIEW  COMPARISON:  04/01/2005.  FINDINGS: Anatomic alignment of the ankle. Small avulsion fracture fragments are present adjacent to the tip of the lateral malleolus. Some of these appear well corticated and chronic however a recurrent acute injury is difficult to exclude. Ankle mortise is congruent. There is a small osteochondral lesion in the lateral talar dome seen on the  mortise view. Anterior tibial spurring is noted on the lateral projection. Small calcaneal spur are incidentally noted.  IMPRESSION: Small avulsion fracture fragments from the lateral malleolar tip, some of which appear chronic and others are age indeterminate and could be acute. 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   Electronically Signed   By: Andreas Newport M.D.   On: 05/05/2015 16:53    Assessment and Plan: 24 y.o. male with right ankle sprain. 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  I don't think he has any new bony injury. Plan for ASO brace NSAIDs and follow-up with orthopedics as needed. Discussed x-ray findings with patient and recommended follow-up for these if not better.  Discussed warning signs or symptoms. Please see discharge instructions. Patient expresses understanding.     Rodolph Bong, MD 05/05/15 [phone]

## 2016-08-24 ENCOUNTER — Encounter (HOSPITAL_COMMUNITY): Payer: Self-pay | Admitting: *Deleted

## 2016-08-24 DIAGNOSIS — Y939 Activity, unspecified: Secondary | ICD-10-CM | POA: Insufficient documentation

## 2016-08-24 DIAGNOSIS — Y929 Unspecified place or not applicable: Secondary | ICD-10-CM | POA: Insufficient documentation

## 2016-08-24 DIAGNOSIS — X58XXXA Exposure to other specified factors, initial encounter: Secondary | ICD-10-CM | POA: Insufficient documentation

## 2016-08-24 DIAGNOSIS — Y999 Unspecified external cause status: Secondary | ICD-10-CM | POA: Insufficient documentation

## 2016-08-24 DIAGNOSIS — S0501XA Injury of conjunctiva and corneal abrasion without foreign body, right eye, initial encounter: Secondary | ICD-10-CM | POA: Insufficient documentation

## 2016-08-24 NOTE — ED Triage Notes (Signed)
The pt reports that he has had a headache since this am with some nausea and vomiting  He feels like his eyes are being pulled backward

## 2016-08-25 ENCOUNTER — Emergency Department (HOSPITAL_COMMUNITY)
Admission: EM | Admit: 2016-08-25 | Discharge: 2016-08-25 | Disposition: A | Discharge status: Home / Self Care | Payer: Self-pay | Attending: Emergency Medicine | Admitting: Emergency Medicine

## 2016-08-25 ENCOUNTER — Encounter (HOSPITAL_COMMUNITY): Payer: Self-pay | Admitting: Emergency Medicine

## 2016-08-25 DIAGNOSIS — S0501XA Injury of conjunctiva and corneal abrasion without foreign body, right eye, initial encounter: Secondary | ICD-10-CM

## 2016-08-25 MED ORDER — TETRACAINE HCL 0.5 % OP SOLN
2.0000 [drp] | Freq: Once | OPHTHALMIC | Status: AC
  Administered 2016-08-25: 2 [drp] via OPHTHALMIC
  Filled 2016-08-25: qty 2

## 2016-08-25 MED ORDER — NAPROXEN 250 MG PO TABS
500.0000 mg | ORAL_TABLET | Freq: Once | ORAL | Status: AC
  Administered 2016-08-25: 500 mg via ORAL
  Filled 2016-08-25: qty 2

## 2016-08-25 MED ORDER — FLUORESCEIN SODIUM 1 MG OP STRP
1.0000 | ORAL_STRIP | Freq: Once | OPHTHALMIC | Status: AC
  Administered 2016-08-25: 1 via OPHTHALMIC
  Filled 2016-08-25: qty 1

## 2016-08-25 MED ORDER — IBUPROFEN 600 MG PO TABS: 600.0000 mg | ORAL_TABLET | Freq: Four times a day (QID) | ORAL | 0 refills | Status: DC | PRN

## 2016-08-25 MED ORDER — ERYTHROMYCIN 5 MG/GM OP OINT: 1.0000 "application " | TOPICAL_OINTMENT | Freq: Four times a day (QID) | OPHTHALMIC | 0 refills | Status: DC

## 2016-08-25 NOTE — ED Provider Notes (Signed)
[facility]-EMERGENCY DEPT Provider Note   CSN: [id number] Arrival date & time: 08/24/16  1950  By signing my name below, I, Doreatha Martin, attest that this documentation has been prepared under the direction and in the presence of Derwood Kaplan, MD. Electronically Signed: Doreatha Martin, ED Scribe. 08/25/16. 1:12 AM.     History   Chief Complaint Chief Complaint  Patient presents with  . Headache    HPI Timothy Patrick is a 25 y.o. male otherwise healthy on no daily medications/supplements who presents to the Emergency Department complaining of throbbing, intermittent, non-radiating occipital HA onset yesterday morning with associated one episode of emesis. Pt currently describes his HA as mild, but states he experiences severe episodes of HA that last ~30 minutes at a time. Pt also reports intermittent HAs for the last 1-2 weeks that are not as severe. Pt states his HA is exacerbated with sound. Pt denies taking OTC medications at home to improve symptoms. He denies recent head trauma. FHx of diagnosed migraines from one brother. Pt also complains of left eye soreness with eyelid closure at this time. He describes his discomfort as a "swelling" and "burning" sensation. He does not wear contact lenses. He denies numbness, tingling, nausea, neck pain, blurry vision.    The history is provided by the patient. No language interpreter was used.    History reviewed. No pertinent past medical history.  There are no active problems to display for this patient.   History reviewed. No pertinent surgical history.     Home Medications    Prior to Admission medications   Medication Sig Start Date End Date Taking? Authorizing Provider  erythromycin [facility]) ophthalmic ointment Place 1 application into the left eye 4 (four) times daily. 08/25/16   Derwood Kaplan, MD  ibuprofen (ADVIL,MOTRIN) 600 MG tablet Take 1 tablet (600 mg total) by mouth every 6 (six) hours as needed. 08/25/16   Derwood Kaplan, MD     Family History No family history on file.  Social History Social History  Substance Use Topics  . Smoking status: Never Smoker  . Smokeless tobacco: Never Used  . Alcohol use Yes     Allergies   Review of patient's allergies indicates no known allergies.   Review of Systems Review of Systems A complete 10 system review of systems was obtained and all systems are negative except as noted in the HPI and PMH.    Physical Exam Updated Vital Signs BP 107/75   Pulse (!) 52   Temp 98.2 F (36.8 C) (Oral)   Resp 18   Ht 6\' 1"  (1.854 m)   Wt 164 lb 7 oz (74.6 kg)   SpO2 100%   BMI 21.69 kg/m   Physical Exam  Constitutional: He appears well-developed and well-nourished.  HENT:  Head: Normocephalic.  Eyes: Conjunctivae are normal.  No nystagmus Pupils are 4 mm, equal and reactive to light. At 9 o'clock on the left eye, there appears to be a clear appearing lesion. No significant scleral injection. Bedside visual fields exam reveals grossly normal visual acuity and intact peripheral vision.   Cardiovascular: Normal rate, regular rhythm and normal heart sounds.   Pulmonary/Chest: Effort normal and breath sounds normal. No respiratory distress.  Abdominal: He exhibits no distension.  Musculoskeletal: Normal range of motion.  Neurological: He is alert. No cranial nerve deficit.  Cranial nerves 2-12 intact.   Skin: Skin is warm and dry.  Psychiatric: He has a normal mood and affect. His behavior is normal.  Nursing note and vitals reviewed.    ED Treatments / Results   DIAGNOSTIC STUDIES: Oxygen Saturation is 98% on RA, normal by my interpretation.    COORDINATION OF CARE: 1:10 AM Discussed treatment plan with pt at bedside which includes pain management, ophthalmology f/u and pt agreed to plan.    Labs (all labs ordered are listed, but only abnormal results are displayed) Labs Reviewed - No data to display  EKG  EKG Interpretation None       Radiology No  results found.  Procedures Procedures (including critical care time)  Medications Ordered in ED Medications  naproxen (NAPROSYN) tablet 500 mg (500 mg Oral Given 08/25/16 0128)  fluorescein ophthalmic strip 1 strip (1 strip Left Eye Given 08/25/16 0129)  tetracaine (PONTOCAINE) 0.5 % ophthalmic solution 2 drop (2 drops Left Eye Given 08/25/16 0131)     Initial Impression / Assessment and Plan / ED Course  I have reviewed the triage vital signs and the nursing notes.  Pertinent labs & imaging results that were available during my care of the patient were reviewed by me and considered in my medical decision making (see chart for details).  Clinical Course   Wood's lamp confirms corneal abrasion. No eye wear - d/c with erythromycin. Headache resolved with naprosyn. Pt had no red flags for emergent etiology of headache that includes bleeds/thrombosis/aneurysm/infection. Headache likely due to the eye pathology.  Final Clinical Impressions(s) / ED Diagnoses   Final diagnoses:  Abrasion of right cornea, initial encounter    New Prescriptions New Prescriptions   ERYTHROMYCIN (ROMYCIN) OPHTHALMIC OINTMENT    Place 1 application into the left eye 4 (four) times daily.   IBUPROFEN (ADVIL,MOTRIN) 600 MG TABLET    Take 1 tablet (600 mg total) by mouth every 6 (six) hours as needed.   I personally performed the services described in this documentation, which was scribed in my presence. The recorded information has been reviewed and is accurate.    Derwood KaplanAnkit Athel Merriweather, MD 08/25/16 [phone]

## 2016-08-25 NOTE — Discharge Instructions (Signed)
You have a corneal abrasion - and will need to apply the antibiotic we are prescribing to avoid any infection. See the eye doctor as requested.

## 2019-05-07 ENCOUNTER — Emergency Department (HOSPITAL_COMMUNITY): Payer: Self-pay

## 2019-05-07 ENCOUNTER — Encounter (HOSPITAL_COMMUNITY): Payer: Self-pay

## 2019-05-07 ENCOUNTER — Emergency Department (HOSPITAL_COMMUNITY)
Admission: EM | Admit: 2019-05-07 | Discharge: 2019-05-07 | Discharge status: Against Medical Advice | Payer: Self-pay | Attending: Emergency Medicine | Admitting: Emergency Medicine

## 2019-05-07 ENCOUNTER — Other Ambulatory Visit: Payer: Self-pay

## 2019-05-07 DIAGNOSIS — R1030 Lower abdominal pain, unspecified: Secondary | ICD-10-CM | POA: Insufficient documentation

## 2019-05-07 DIAGNOSIS — R109 Unspecified abdominal pain: Secondary | ICD-10-CM

## 2019-05-07 DIAGNOSIS — Z5320 Procedure and treatment not carried out because of patient's decision for unspecified reasons: Secondary | ICD-10-CM | POA: Insufficient documentation

## 2019-05-07 LAB — URINALYSIS, ROUTINE W REFLEX MICROSCOPIC
Bacteria, UA: NONE SEEN
Bilirubin Urine: NEGATIVE
Glucose, UA: NEGATIVE mg/dL
Hgb urine dipstick: NEGATIVE
Ketones, ur: 20 mg/dL — AB
Leukocytes,Ua: NEGATIVE
Nitrite: NEGATIVE
Protein, ur: 100 mg/dL — AB
Specific Gravity, Urine: 1.031 — ABNORMAL HIGH (ref 1.005–1.030)
pH: 5 (ref 5.0–8.0)

## 2019-05-07 LAB — COMPREHENSIVE METABOLIC PANEL
ALT: 24 U/L (ref 0–44)
AST: 27 U/L (ref 15–41)
Albumin: 5.4 g/dL — ABNORMAL HIGH (ref 3.5–5.0)
Alkaline Phosphatase: 91 U/L (ref 38–126)
Anion gap: 14 (ref 5–15)
BUN: 17 mg/dL (ref 6–20)
CO2: 23 mmol/L (ref 22–32)
Calcium: 10 mg/dL (ref 8.9–10.3)
Chloride: 107 mmol/L (ref 98–111)
Creatinine, Ser: 1.25 mg/dL — ABNORMAL HIGH (ref 0.61–1.24)
GFR calc Af Amer: >60 mL/min (ref >60)
GFR calc non Af Amer: >60 mL/min (ref >60)
Glucose, Bld: 81 mg/dL (ref 70–99)
Potassium: 4.4 mmol/L (ref 3.5–5.1)
Sodium: 144 mmol/L (ref 135–145)
Total Bilirubin: 0.9 mg/dL (ref 0.3–1.2)
Total Protein: 9.1 g/dL — ABNORMAL HIGH (ref 6.5–8.1)

## 2019-05-07 LAB — CBC
HCT: 48.1 % (ref 39.0–52.0)
Hemoglobin: 15.4 g/dL (ref 13.0–17.0)
MCH: 28.7 pg (ref 26.0–34.0)
MCHC: 32 g/dL (ref 30.0–36.0)
MCV: 89.7 fL (ref 80.0–100.0)
Platelets: 247 10*3/uL (ref 150–400)
RBC: 5.36 MIL/uL (ref 4.22–5.81)
RDW: 13.8 % (ref 11.5–15.5)
WBC: 14.9 10*3/uL — ABNORMAL HIGH (ref 4.0–10.5)
nRBC: 0 % (ref 0.0–0.2)

## 2019-05-07 LAB — LIPASE, BLOOD: Lipase: 24 U/L (ref 11–51)

## 2019-05-07 MED ORDER — IOHEXOL 300 MG/ML  SOLN
100.0000 mL | Freq: Once | INTRAMUSCULAR | Status: AC | PRN
  Administered 2019-05-07: 100 mL via INTRAVENOUS

## 2019-05-07 MED ORDER — SODIUM CHLORIDE (PF) 0.9 % IJ SOLN
INTRAMUSCULAR | Status: AC
  Filled 2019-05-07: qty 50

## 2019-05-07 MED ORDER — SODIUM CHLORIDE 0.9 % IV BOLUS
1000.0000 mL | Freq: Once | INTRAVENOUS | Status: AC
  Administered 2019-05-07: 1000 mL via INTRAVENOUS

## 2019-05-07 MED ORDER — SODIUM CHLORIDE 0.9% FLUSH
3.0000 mL | Freq: Once | INTRAVENOUS | Status: AC
  Administered 2019-05-07: 3 mL via INTRAVENOUS

## 2019-05-07 MED ORDER — MORPHINE SULFATE (PF) 4 MG/ML IV SOLN
4.0000 mg | Freq: Once | INTRAVENOUS | Status: AC
  Administered 2019-05-07: 4 mg via INTRAVENOUS
  Filled 2019-05-07: qty 1

## 2019-05-07 MED ORDER — SODIUM CHLORIDE 0.9 % IV SOLN
INTRAVENOUS | Status: DC
  Administered 2019-05-07: 18:00:00 via INTRAVENOUS

## 2019-05-07 MED ORDER — PROCHLORPERAZINE EDISYLATE 10 MG/2ML IJ SOLN
10.0000 mg | Freq: Once | INTRAMUSCULAR | Status: AC
  Administered 2019-05-07: 18:00:00 10 mg via INTRAVENOUS
  Filled 2019-05-07: qty 2

## 2019-05-07 NOTE — ED Notes (Signed)
Pt pulled out his IV while connected to fluids  Left it running in the sink and left AMA

## 2019-05-07 NOTE — ED Notes (Signed)
Bed: WA09 Expected date:  Expected time:  Means of arrival:  Comments: EMS 

## 2019-05-07 NOTE — ED Triage Notes (Signed)
Patient arrived by EMS from home. Pt c/o ABD pain. Pt vomited for several hours per EMS. Pt has lower RT and LFT quadrant pain per EMS.    Pt stated he's having sharp pain per EMS.   Patient stated he drank a lot of liquor yesterday.

## 2019-05-07 NOTE — ED Provider Notes (Signed)
[facility]-EMERGENCY DEPT Provider Note   CSN: [id number] Arrival date & time: 05/07/19  1648    History   Chief Complaint Chief Complaint  Patient presents with  . Abdominal Pain    HPI Timothy Patrick is a 28 y.o. male.     HPI Patient presents emergency room for evaluation of lower abdominal pain.  Patient is at having a cramping pain in his lower abdomen.  Primarily in the suprapubic region.  Patient states the pain is coming and going in waves.  Associated with this pain is had multiple episodes of nausea and vomiting.  Lately he has just been dry heaving and has not been able to keep anything down.  Patient did feel like he got overheated earlier in the day.  He did have a syncopal episodes associated with that.  Patient states he had been drinking last night and that could be a contributing factor.  He denies any diarrhea or constipation.  In fact he feels like he has to have a bowel movement.  He denies any dysuria.  He denies any prior history of abdominal pain problems. History reviewed. No pertinent past medical history.  There are no active problems to display for this patient.   History reviewed. No pertinent surgical history.      Home Medications    Prior to Admission medications   Medication Sig Start Date End Date Taking? Authorizing Provider  erythromycin [facility](ROMYCIN) ophthalmic ointment Place 1 application into the left eye 4 (four) times daily. 08/25/16   Derwood KaplanNanavati, Ankit, MD  ibuprofen (ADVIL,MOTRIN) 600 MG tablet Take 1 tablet (600 mg total) by mouth every 6 (six) hours as needed. 08/25/16   Derwood KaplanNanavati, Ankit, MD  naproxen (NAPROSYN) 500 MG tablet Take 1 tablet (500 mg total) by mouth 2 (two) times daily. 05/05/15   Rodolph Bongorey, Evan S, MD    Family History History reviewed. No pertinent family history.  Social History Social History   Tobacco Use  . Smoking status: Never Smoker  . Smokeless tobacco: Never Used  Substance Use Topics  .  Alcohol use: Yes  . Drug use: Not on file     Allergies   Patient has no known allergies.   Review of Systems Review of Systems  All other systems reviewed and are negative.    Physical Exam Updated Vital Signs BP (!) 119/91   Pulse 94   Temp 97.6 F (36.4 C) (Oral)   Resp 14   Ht 1.854 m (6\' 1" )   Wt 83.9 kg   SpO2 98%   BMI 24.41 kg/m   Physical Exam Vitals signs and nursing note reviewed.  Constitutional:      General: He is not in acute distress.    Appearance: He is well-developed.  HENT:     Head: Normocephalic and atraumatic.     Right Ear: External ear normal.     Left Ear: External ear normal.  Eyes:     General: No scleral icterus.       Right eye: No discharge.        Left eye: No discharge.     Conjunctiva/sclera: Conjunctivae normal.  Neck:     Musculoskeletal: Neck supple.     Trachea: No tracheal deviation.  Cardiovascular:     Rate and Rhythm: Normal rate and regular rhythm.  Pulmonary:     Effort: Pulmonary effort is normal. No respiratory distress.     Breath sounds: Normal breath sounds. No stridor. No wheezing or  rales.  Abdominal:     General: Bowel sounds are normal. There is no distension.     Palpations: Abdomen is soft.     Tenderness: There is abdominal tenderness in the suprapubic area. There is no guarding or rebound.  Musculoskeletal:        General: No tenderness.  Skin:    General: Skin is warm and dry.     Findings: No rash.  Neurological:     Mental Status: He is alert.     Cranial Nerves: No cranial nerve deficit (no facial droop, extraocular movements intact, no slurred speech).     Sensory: No sensory deficit.     Motor: No abnormal muscle tone or seizure activity.     Coordination: Coordination normal.      ED Treatments / Results  Labs (all labs ordered are listed, but only abnormal results are displayed) Labs Reviewed  COMPREHENSIVE METABOLIC PANEL - Abnormal; Notable for the following components:       Result Value   Creatinine, Ser 1.25 (*)    Total Protein 9.1 (*)    Albumin 5.4 (*)    All other components within normal limits  CBC - Abnormal; Notable for the following components:   WBC 14.9 (*)    All other components within normal limits  URINALYSIS, ROUTINE W REFLEX MICROSCOPIC - Abnormal; Notable for the following components:   APPearance TURBID (*)    Specific Gravity, Urine 1.031 (*)    Ketones, ur 20 (*)    Protein, ur 100 (*)    All other components within normal limits  LIPASE, BLOOD    EKG EKG Interpretation  Date/Time:  Monday May 07 2019 17:28:52 EDT Ventricular Rate:  81 PR Interval:    QRS Duration: 81 QT Interval:  346 QTC Calculation: 402 R Axis:   68 Text Interpretation:  Sinus rhythm ST elevation suggests acute pericarditis Baseline wander in lead(s) V2 No old tracing to compare Confirmed by Dorie Rank [phone]) on 05/07/2019 5:35:03 PM   Radiology Ct Abdomen Pelvis W Contrast  Result Date: 05/07/2019 CLINICAL DATA:  Acute abdominal pain. EXAM: CT ABDOMEN AND PELVIS WITH CONTRAST TECHNIQUE: Multidetector CT imaging of the abdomen and pelvis was performed using the standard protocol following bolus administration of intravenous contrast. CONTRAST:  143mL OMNIPAQUE IOHEXOL 300 MG/ML  SOLN COMPARISON:  CT 12/21/2007 FINDINGS: Lower chest: Lung bases are clear lung for motion. Hepatobiliary: No focal liver abnormality is seen. No gallstones, gallbladder wall thickening, or biliary dilatation. Pancreas: No ductal dilatation or inflammation. Spleen: Normal in size without focal abnormality. Adrenals/Urinary Tract: Normal adrenal glands. No hydronephrosis or perinephric edema. Homogeneous renal enhancement. Urinary bladder is physiologically distended without wall thickening. Stomach/Bowel: The stomach is nondistended. No evidence of gastric wall thickening. No small bowel obstruction or inflammatory change. Appendix is air-filled and normal, image 38 series 2. Small  volume of stool throughout the colon. No pericolonic inflammation. Vascular/Lymphatic: Normal caliber abdominal aorta. No acute vascular findings. Portal vein and mesenteric vessels are patent. No adenopathy. Reproductive: Prostate is unremarkable. Other: No free air, free fluid, or intra-abdominal fluid collection. Musculoskeletal: There are no acute or suspicious osseous abnormalities. IMPRESSION: No acute abnormality or explanation for abdominal pain. Electronically Signed   By: Keith Rake M.D.   On: 05/07/2019 19:20    Procedures Procedures (including critical care time)  Medications Ordered in ED Medications  sodium chloride 0.9 % bolus 1,000 mL (0 mLs Intravenous Stopped 05/07/19 1957)    And  0.9 %  sodium chloride infusion ( Intravenous Stopped 05/07/19 1957)  sodium chloride (PF) 0.9 % injection (has no administration in time range)  sodium chloride flush (NS) 0.9 % injection 3 mL (3 mLs Intravenous Given 05/07/19 1713)  morphine 4 MG/ML injection 4 mg (4 mg Intravenous Given 05/07/19 1736)  prochlorperazine (COMPAZINE) injection 10 mg (10 mg Intravenous Given 05/07/19 1733)  iohexol (OMNIPAQUE) 300 MG/ML solution 100 mL (100 mLs Intravenous Contrast Given 05/07/19 1844)     Initial Impression / Assessment and Plan / ED Course  I have reviewed the triage vital signs and the nursing notes.  Pertinent labs & imaging results that were available during my care of the patient were reviewed by me and considered in my medical decision making (see chart for details).  Clinical Course as of May 06 2008  Mon May 07, 2019  1830 Labs reviewed.  White blood cell count elevated.  We will proceed with CT scan.   [JK]    Clinical Course User Index [JK] Linwood DibblesKnapp, Mellany Dinsmore, MD     Patient CT scan did not show any acute abnormalities.  No clear etiology for his symptoms.  Patient left before I was able to go over his laboratory tests and CT scan findings with him.  He eloped without notifying anyone.   Final Clinical Impressions(s) / ED Diagnoses   Final diagnoses:  Abdominal pain, unspecified abdominal location    ED Discharge Orders    None       Linwood DibblesKnapp, Rebecca Cairns, MD 05/07/19 2009

## 2019-05-07 NOTE — ED Notes (Addendum)
Triage was put in by [facility]. This RN charted under [place] [facility] NT by accident.

## 2019-05-07 NOTE — ED Notes (Signed)
Pt pulled IV out and left without a word. Provider notified

## 2019-05-07 NOTE — ED Notes (Signed)
Unable to do closing Vital signs or pain assessment

## 2019-07-30 ENCOUNTER — Emergency Department (HOSPITAL_BASED_OUTPATIENT_CLINIC_OR_DEPARTMENT_OTHER)
Admission: EM | Admit: 2019-07-30 | Discharge: 2019-07-30 | Disposition: A | Discharge status: Home / Self Care | Payer: HRSA Program | Attending: Emergency Medicine | Admitting: Emergency Medicine

## 2019-07-30 ENCOUNTER — Encounter (HOSPITAL_BASED_OUTPATIENT_CLINIC_OR_DEPARTMENT_OTHER): Payer: Self-pay

## 2019-07-30 ENCOUNTER — Other Ambulatory Visit: Payer: Self-pay

## 2019-07-30 DIAGNOSIS — Z20828 Contact with and (suspected) exposure to other viral communicable diseases: Secondary | ICD-10-CM | POA: Diagnosis not present

## 2019-07-30 DIAGNOSIS — Z79899 Other long term (current) drug therapy: Secondary | ICD-10-CM | POA: Insufficient documentation

## 2019-07-30 DIAGNOSIS — F17210 Nicotine dependence, cigarettes, uncomplicated: Secondary | ICD-10-CM | POA: Insufficient documentation

## 2019-07-30 DIAGNOSIS — B349 Viral infection, unspecified: Secondary | ICD-10-CM | POA: Insufficient documentation

## 2019-07-30 DIAGNOSIS — Z791 Long term (current) use of non-steroidal anti-inflammatories (NSAID): Secondary | ICD-10-CM | POA: Diagnosis not present

## 2019-07-30 DIAGNOSIS — Z20822 Contact with and (suspected) exposure to covid-19: Secondary | ICD-10-CM

## 2019-07-30 DIAGNOSIS — R509 Fever, unspecified: Secondary | ICD-10-CM | POA: Diagnosis present

## 2019-07-30 NOTE — ED Triage Notes (Signed)
Pt c/o chills, weakness x 1 week-states his employer sent him for covid test-pt NAD-steady gait

## 2019-07-30 NOTE — Discharge Instructions (Addendum)
Your COVID test will result in the next 48 hours or so.  You will receive a phone call if your test is positive, no phone call if your test is negative.  You can check your results on MyChart as additionally.  Quarantine at home until your results return.  If your test result is negative, call your employer for further recommendations.  If your test result is positive, isolate at home for at least 10 days and symptom onset in 3 days fever free without the use of ibuprofen or Tylenol.  Wash your hands frequently.  Avoid any contact with old people, young people, or people with medical problems.  Do not leave the house during this time.  Drink plenty fluids and get plenty of rest.  You can take 1 to 2 tablets of Tylenol (350mg -1000mg  depending on the dose) every 6 hours as needed for pain or fever.  Do not exceed 4000 mg of Tylenol daily.    Please return to the emergency department immediately if any concerning signs or symptoms develop such as worsening shortness of breath, high fevers, severe chest pains, persistent cough.
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
## 2019-07-31 LAB — NOVEL CORONAVIRUS, NAA (HOSP ORDER, SEND-OUT TO REF LAB; TAT 18-24 HRS): SARS-CoV-2, NAA: NOT DETECTED

## 2020-01-25 ENCOUNTER — Encounter (HOSPITAL_COMMUNITY): Payer: Self-pay | Admitting: Emergency Medicine

## 2020-01-25 ENCOUNTER — Emergency Department (HOSPITAL_COMMUNITY): Payer: Self-pay

## 2020-01-25 ENCOUNTER — Emergency Department (HOSPITAL_COMMUNITY)
Admission: EM | Admit: 2020-01-25 | Discharge: 2020-01-26 | Disposition: A | Discharge status: Home / Self Care | Payer: Self-pay | Attending: Emergency Medicine | Admitting: Emergency Medicine

## 2020-01-25 DIAGNOSIS — W3400XA Accidental discharge from unspecified firearms or gun, initial encounter: Secondary | ICD-10-CM | POA: Insufficient documentation

## 2020-01-25 DIAGNOSIS — Y908 Blood alcohol level of 240 mg/100 ml or more: Secondary | ICD-10-CM | POA: Insufficient documentation

## 2020-01-25 DIAGNOSIS — T1490XA Injury, unspecified, initial encounter: Secondary | ICD-10-CM

## 2020-01-25 DIAGNOSIS — Y929 Unspecified place or not applicable: Secondary | ICD-10-CM | POA: Insufficient documentation

## 2020-01-25 DIAGNOSIS — Z23 Encounter for immunization: Secondary | ICD-10-CM | POA: Insufficient documentation

## 2020-01-25 DIAGNOSIS — S71131A Puncture wound without foreign body, right thigh, initial encounter: Secondary | ICD-10-CM | POA: Insufficient documentation

## 2020-01-25 DIAGNOSIS — F10920 Alcohol use, unspecified with intoxication, uncomplicated: Secondary | ICD-10-CM | POA: Insufficient documentation

## 2020-01-25 DIAGNOSIS — Y999 Unspecified external cause status: Secondary | ICD-10-CM | POA: Insufficient documentation

## 2020-01-25 DIAGNOSIS — Y939 Activity, unspecified: Secondary | ICD-10-CM | POA: Insufficient documentation

## 2020-01-25 LAB — I-STAT CHEM 8, ED
BUN: 14 mg/dL (ref 6–20)
Calcium, Ion: 1.05 mmol/L — ABNORMAL LOW (ref 1.15–1.40)
Chloride: 106 mmol/L (ref 98–111)
Creatinine, Ser: 1.7 mg/dL — ABNORMAL HIGH (ref 0.61–1.24)
Glucose, Bld: 89 mg/dL (ref 70–99)
HCT: 44 % (ref 39.0–52.0)
Hemoglobin: 15 g/dL (ref 13.0–17.0)
Potassium: 3.4 mmol/L — ABNORMAL LOW (ref 3.5–5.1)
Sodium: 141 mmol/L (ref 135–145)
TCO2: 23 mmol/L (ref 22–32)

## 2020-01-25 LAB — COMPREHENSIVE METABOLIC PANEL
ALT: 32 U/L (ref 0–44)
AST: 27 U/L (ref 15–41)
Albumin: 4.4 g/dL (ref 3.5–5.0)
Alkaline Phosphatase: 55 U/L (ref 38–126)
Anion gap: 16 — ABNORMAL HIGH (ref 5–15)
BUN: 12 mg/dL (ref 6–20)
CO2: 18 mmol/L — ABNORMAL LOW (ref 22–32)
Calcium: 9.3 mg/dL (ref 8.9–10.3)
Chloride: 107 mmol/L (ref 98–111)
Creatinine, Ser: 1.24 mg/dL (ref 0.61–1.24)
GFR calc Af Amer: >60 mL/min (ref >60)
GFR calc non Af Amer: >60 mL/min (ref >60)
Glucose, Bld: 91 mg/dL (ref 70–99)
Potassium: 3.7 mmol/L (ref 3.5–5.1)
Sodium: 141 mmol/L (ref 135–145)
Total Bilirubin: 0.4 mg/dL (ref 0.3–1.2)
Total Protein: 7.7 g/dL (ref 6.5–8.1)

## 2020-01-25 LAB — SAMPLE TO BLOOD BANK

## 2020-01-25 LAB — CBC
HCT: 41.9 % (ref 39.0–52.0)
Hemoglobin: 13.6 g/dL (ref 13.0–17.0)
MCH: 28.8 pg (ref 26.0–34.0)
MCHC: 32.5 g/dL (ref 30.0–36.0)
MCV: 88.8 fL (ref 80.0–100.0)
Platelets: 249 10*3/uL (ref 150–400)
RBC: 4.72 MIL/uL (ref 4.22–5.81)
RDW: 13.5 % (ref 11.5–15.5)
WBC: 8.5 10*3/uL (ref 4.0–10.5)
nRBC: 0 % (ref 0.0–0.2)

## 2020-01-25 LAB — PROTIME-INR
INR: 0.9 (ref 0.8–1.2)
Prothrombin Time: 12.2 seconds (ref 11.4–15.2)

## 2020-01-25 LAB — ETHANOL: Alcohol, Ethyl (B): 260 mg/dL — ABNORMAL HIGH (ref <10)

## 2020-01-25 MED ORDER — TETANUS-DIPHTH-ACELL PERTUSSIS 5-2.5-18.5 LF-MCG/0.5 IM SUSP
0.5000 mL | Freq: Once | INTRAMUSCULAR | Status: AC
  Administered 2020-01-25: 0.5 mL via INTRAMUSCULAR
  Filled 2020-01-25: qty 0.5

## 2020-01-25 MED ORDER — SODIUM CHLORIDE 0.9 % IV BOLUS
1000.0000 mL | Freq: Once | INTRAVENOUS | Status: AC
  Administered 2020-01-25: 1000 mL via INTRAVENOUS

## 2020-01-25 MED ORDER — LACTATED RINGERS IV BOLUS
1000.0000 mL | Freq: Once | INTRAVENOUS | Status: AC
  Administered 2020-01-25: 1000 mL via INTRAVENOUS

## 2020-01-25 MED ORDER — HYDROCODONE-ACETAMINOPHEN 5-325 MG PO TABS: 1.0000 | ORAL_TABLET | ORAL | 0 refills | Status: DC | PRN

## 2020-01-25 MED ORDER — DOXYCYCLINE HYCLATE 100 MG PO CAPS: 100.0000 mg | ORAL_CAPSULE | Freq: Two times a day (BID) | ORAL | 0 refills | Status: AC

## 2020-01-25 MED ORDER — FENTANYL CITRATE (PF) 100 MCG/2ML IJ SOLN
50.0000 ug | INTRAMUSCULAR | Status: DC | PRN
  Administered 2020-01-25 (×2): 50 ug via INTRAVENOUS
  Filled 2020-01-25 (×2): qty 2
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
## 2020-01-25 NOTE — ED Notes (Signed)
Assumed care on patient , patient sleeping with no distress, respirations unlabored , IV site intact , dressing at right thigh intact .

## 2020-01-25 NOTE — ED Notes (Signed)
Pt responsive to pain initially, increasingly alert w/ questioning, oriented to person, place, and time at this time.
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
## 2020-01-25 NOTE — Progress Notes (Signed)
The chaplain briefly spoke to the patient who did not open his eyes and did not seem to comprehend what the chaplain was saying. He did respond through nodding. The chaplain is available for follow-up if needed.  Lavone Neri Chaplain Resident For questions concerning this note please contact me by pager [phone]
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
## 2020-01-28 ENCOUNTER — Encounter (HOSPITAL_BASED_OUTPATIENT_CLINIC_OR_DEPARTMENT_OTHER): Payer: Self-pay

## 2020-04-06 ENCOUNTER — Encounter (HOSPITAL_COMMUNITY): Payer: Self-pay | Admitting: Emergency Medicine

## 2020-04-06 ENCOUNTER — Emergency Department (HOSPITAL_COMMUNITY)
Admission: EM | Admit: 2020-04-06 | Discharge: 2020-04-06 | Disposition: A | Discharge status: Home / Self Care | Payer: Self-pay | Attending: Emergency Medicine | Admitting: Emergency Medicine

## 2020-04-06 ENCOUNTER — Other Ambulatory Visit: Payer: Self-pay

## 2020-04-06 ENCOUNTER — Emergency Department (HOSPITAL_COMMUNITY): Payer: Self-pay

## 2020-04-06 DIAGNOSIS — F1210 Cannabis abuse, uncomplicated: Secondary | ICD-10-CM | POA: Insufficient documentation

## 2020-04-06 DIAGNOSIS — Y9389 Activity, other specified: Secondary | ICD-10-CM | POA: Insufficient documentation

## 2020-04-06 DIAGNOSIS — Z23 Encounter for immunization: Secondary | ICD-10-CM | POA: Insufficient documentation

## 2020-04-06 DIAGNOSIS — Y929 Unspecified place or not applicable: Secondary | ICD-10-CM | POA: Insufficient documentation

## 2020-04-06 DIAGNOSIS — S41101A Unspecified open wound of right upper arm, initial encounter: Secondary | ICD-10-CM | POA: Insufficient documentation

## 2020-04-06 DIAGNOSIS — F17210 Nicotine dependence, cigarettes, uncomplicated: Secondary | ICD-10-CM | POA: Insufficient documentation

## 2020-04-06 DIAGNOSIS — W3400XA Accidental discharge from unspecified firearms or gun, initial encounter: Secondary | ICD-10-CM | POA: Insufficient documentation

## 2020-04-06 DIAGNOSIS — Y998 Other external cause status: Secondary | ICD-10-CM | POA: Insufficient documentation

## 2020-04-06 MED ORDER — TETANUS-DIPHTH-ACELL PERTUSSIS 5-2.5-18.5 LF-MCG/0.5 IM SUSP
0.5000 mL | Freq: Once | INTRAMUSCULAR | Status: AC
  Administered 2020-04-06: 0.5 mL via INTRAMUSCULAR
  Filled 2020-04-06: qty 0.5
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
## 2020-06-02 ENCOUNTER — Emergency Department (HOSPITAL_COMMUNITY): Payer: Self-pay

## 2020-06-02 ENCOUNTER — Emergency Department (HOSPITAL_COMMUNITY)
Admission: EM | Admit: 2020-06-02 | Discharge: 2020-06-02 | Disposition: A | Discharge status: Home / Self Care | Payer: Self-pay | Attending: Emergency Medicine | Admitting: Emergency Medicine

## 2020-06-02 ENCOUNTER — Other Ambulatory Visit: Payer: Self-pay

## 2020-06-02 DIAGNOSIS — S91002A Unspecified open wound, left ankle, initial encounter: Secondary | ICD-10-CM | POA: Insufficient documentation

## 2020-06-02 DIAGNOSIS — F17210 Nicotine dependence, cigarettes, uncomplicated: Secondary | ICD-10-CM | POA: Insufficient documentation

## 2020-06-02 DIAGNOSIS — X58XXXA Exposure to other specified factors, initial encounter: Secondary | ICD-10-CM | POA: Insufficient documentation

## 2020-06-02 DIAGNOSIS — Y999 Unspecified external cause status: Secondary | ICD-10-CM | POA: Insufficient documentation

## 2020-06-02 DIAGNOSIS — S91032A Puncture wound without foreign body, left ankle, initial encounter: Secondary | ICD-10-CM

## 2020-06-02 DIAGNOSIS — Y9389 Activity, other specified: Secondary | ICD-10-CM | POA: Insufficient documentation

## 2020-06-02 DIAGNOSIS — Y929 Unspecified place or not applicable: Secondary | ICD-10-CM | POA: Insufficient documentation

## 2020-06-02 LAB — CBC WITH DIFFERENTIAL/PLATELET
Abs Immature Granulocytes: 0.05 10*3/uL (ref 0.00–0.07)
Basophils Absolute: 0 10*3/uL (ref 0.0–0.1)
Basophils Relative: 0 %
Eosinophils Absolute: 0.1 10*3/uL (ref 0.0–0.5)
Eosinophils Relative: 1 %
HCT: 40.8 % (ref 39.0–52.0)
Hemoglobin: 13.6 g/dL (ref 13.0–17.0)
Immature Granulocytes: 0 %
Lymphocytes Relative: 7 %
Lymphs Abs: 1.1 10*3/uL (ref 0.7–4.0)
MCH: 29.6 pg (ref 26.0–34.0)
MCHC: 33.3 g/dL (ref 30.0–36.0)
MCV: 88.9 fL (ref 80.0–100.0)
Monocytes Absolute: 1.2 10*3/uL — ABNORMAL HIGH (ref 0.1–1.0)
Monocytes Relative: 8 %
Neutro Abs: 12.5 10*3/uL — ABNORMAL HIGH (ref 1.7–7.7)
Neutrophils Relative %: 84 %
Platelets: 234 10*3/uL (ref 150–400)
RBC: 4.59 MIL/uL (ref 4.22–5.81)
RDW: 12.9 % (ref 11.5–15.5)
WBC: 15 10*3/uL — ABNORMAL HIGH (ref 4.0–10.5)
nRBC: 0 % (ref 0.0–0.2)

## 2020-06-02 LAB — BASIC METABOLIC PANEL
Anion gap: 11 (ref 5–15)
BUN: 9 mg/dL (ref 6–20)
CO2: 22 mmol/L (ref 22–32)
Calcium: 8.9 mg/dL (ref 8.9–10.3)
Chloride: 106 mmol/L (ref 98–111)
Creatinine, Ser: 0.98 mg/dL (ref 0.61–1.24)
GFR calc Af Amer: >60 mL/min (ref >60)
GFR calc non Af Amer: >60 mL/min (ref >60)
Glucose, Bld: 103 mg/dL — ABNORMAL HIGH (ref 70–99)
Potassium: 3.7 mmol/L (ref 3.5–5.1)
Sodium: 139 mmol/L (ref 135–145)

## 2020-06-02 MED ORDER — CEPHALEXIN 500 MG PO CAPS
500.0000 mg | ORAL_CAPSULE | Freq: Two times a day (BID) | ORAL | 0 refills | Status: AC
Start: 2020-06-02 — End: 2020-06-09

## 2020-06-02 MED ORDER — IBUPROFEN 800 MG PO TABS
800.0000 mg | ORAL_TABLET | Freq: Three times a day (TID) | ORAL | 0 refills | Status: DC | PRN
Start: 2020-06-02 — End: 2023-08-22

## 2020-06-02 MED ORDER — KETOROLAC TROMETHAMINE 30 MG/ML IJ SOLN
30.0000 mg | Freq: Once | INTRAMUSCULAR | Status: AC
  Administered 2020-06-02: 30 mg via INTRAVENOUS
  Filled 2020-06-02: qty 1

## 2020-06-02 MED ORDER — CEFAZOLIN SODIUM-DEXTROSE 1-4 GM/50ML-% IV SOLN
1.0000 g | Freq: Once | INTRAVENOUS | Status: AC
  Administered 2020-06-02: 1 g via INTRAVENOUS
  Filled 2020-06-02: qty 50

## 2020-06-02 MED ORDER — IOHEXOL 350 MG/ML SOLN
100.0000 mL | Freq: Once | INTRAVENOUS | Status: AC | PRN
  Administered 2020-06-02: 100 mL via INTRAVENOUS

## 2020-06-02 MED ORDER — ACETAMINOPHEN 500 MG PO TABS
1000.0000 mg | ORAL_TABLET | Freq: Four times a day (QID) | ORAL | 0 refills | Status: DC | PRN
Start: 2020-06-02 — End: 2023-08-22

## 2020-06-02 MED ORDER — FENTANYL CITRATE (PF) 100 MCG/2ML IJ SOLN
50.0000 ug | Freq: Once | INTRAMUSCULAR | Status: AC
  Administered 2020-06-02: 50 ug via INTRAVENOUS
  Filled 2020-06-02: qty 2

## 2020-06-02 MED ORDER — OXYCODONE-ACETAMINOPHEN 5-325 MG PO TABS
1.0000 | ORAL_TABLET | Freq: Once | ORAL | Status: AC
  Administered 2020-06-02: 1 via ORAL
  Filled 2020-06-02: qty 1

## 2020-06-02 MED ORDER — CEPHALEXIN 500 MG PO CAPS: 500.0000 mg | ORAL_CAPSULE | Freq: Three times a day (TID) | ORAL | 0 refills | Status: DC
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
## 2020-06-02 NOTE — Progress Notes (Signed)
Orthopedic Tech Progress Note Patient Details:  Timothy Patrick 07-17-1991 [id number]  Ortho Devices Type of Ortho Device: Stirrup splint, Post (short leg) splint Ortho Device/Splint Location: LLE Ortho Device/Splint Interventions: Ordered, Application   Post Interventions Patient Tolerated: Fair, Well Instructions Provided: Care of device   [name] 06/02/2020, 7:42 AM
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
## 2020-06-02 NOTE — ED Notes (Signed)
Patient verbalizes understanding of discharge instructions . Opportunity for questions and answers were provided . Armband removed by staff ,Pt discharged from ED. W/C  offered at D/C  and Declined W/C at D/C and was escorted to lobby by RN.  
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
## 2023-08-22 ENCOUNTER — Encounter (HOSPITAL_COMMUNITY): Payer: Self-pay | Admitting: Registered Nurse

## 2023-08-22 ENCOUNTER — Other Ambulatory Visit: Payer: Self-pay

## 2023-08-22 ENCOUNTER — Ambulatory Visit (HOSPITAL_COMMUNITY)
Admission: EM | Admit: 2023-08-22 | Discharge: 2023-08-23 | Disposition: A | Discharge status: Other Acute Inpatient Hospital | Payer: Medicaid Other | Attending: Registered Nurse | Admitting: Registered Nurse

## 2023-08-22 DIAGNOSIS — F1020 Alcohol dependence, uncomplicated: Secondary | ICD-10-CM | POA: Insufficient documentation

## 2023-08-22 DIAGNOSIS — Y903 Blood alcohol level of 60-79 mg/100 ml: Secondary | ICD-10-CM | POA: Insufficient documentation

## 2023-08-22 DIAGNOSIS — F1010 Alcohol abuse, uncomplicated: Secondary | ICD-10-CM | POA: Diagnosis present

## 2023-08-22 DIAGNOSIS — F4323 Adjustment disorder with mixed anxiety and depressed mood: Secondary | ICD-10-CM | POA: Diagnosis present

## 2023-08-22 DIAGNOSIS — F17210 Nicotine dependence, cigarettes, uncomplicated: Secondary | ICD-10-CM | POA: Insufficient documentation

## 2023-08-22 DIAGNOSIS — F4321 Adjustment disorder with depressed mood: Secondary | ICD-10-CM | POA: Diagnosis present

## 2023-08-22 DIAGNOSIS — Z634 Disappearance and death of family member: Secondary | ICD-10-CM | POA: Insufficient documentation

## 2023-08-22 LAB — CBC WITH DIFFERENTIAL/PLATELET
Abs Immature Granulocytes: 0.02 10*3/uL (ref 0.00–0.07)
Basophils Absolute: 0 10*3/uL (ref 0.0–0.1)
Basophils Relative: 1 %
Eosinophils Absolute: 0.2 10*3/uL (ref 0.0–0.5)
Eosinophils Relative: 4 %
HCT: 43.6 % (ref 39.0–52.0)
Hemoglobin: 14.1 g/dL (ref 13.0–17.0)
Immature Granulocytes: 0 %
Lymphocytes Relative: 24 %
Lymphs Abs: 1.4 10*3/uL (ref 0.7–4.0)
MCH: 28.5 pg (ref 26.0–34.0)
MCHC: 32.3 g/dL (ref 30.0–36.0)
MCV: 88.3 fL (ref 80.0–100.0)
Monocytes Absolute: 0.6 10*3/uL (ref 0.1–1.0)
Monocytes Relative: 10 %
Neutro Abs: 3.6 10*3/uL (ref 1.7–7.7)
Neutrophils Relative %: 61 %
Platelets: 284 10*3/uL (ref 150–400)
RBC: 4.94 MIL/uL (ref 4.22–5.81)
RDW: 14.1 % (ref 11.5–15.5)
WBC: 5.8 10*3/uL (ref 4.0–10.5)
nRBC: 0 % (ref 0.0–0.2)

## 2023-08-22 LAB — COMPREHENSIVE METABOLIC PANEL
ALT: 26 U/L (ref 0–44)
AST: 25 U/L (ref 15–41)
Albumin: 4.6 g/dL (ref 3.5–5.0)
Alkaline Phosphatase: 60 U/L (ref 38–126)
Anion gap: 10 (ref 5–15)
BUN: 8 mg/dL (ref 6–20)
CO2: 28 mmol/L (ref 22–32)
Calcium: 9.5 mg/dL (ref 8.9–10.3)
Chloride: 104 mmol/L (ref 98–111)
Creatinine, Ser: 1.05 mg/dL (ref 0.61–1.24)
GFR, Estimated: >60 mL/min (ref >60)
Glucose, Bld: 72 mg/dL (ref 70–99)
Potassium: 3.8 mmol/L (ref 3.5–5.1)
Sodium: 142 mmol/L (ref 135–145)
Total Bilirubin: 0.6 mg/dL (ref 0.3–1.2)
Total Protein: 7.9 g/dL (ref 6.5–8.1)

## 2023-08-22 LAB — URINALYSIS, ROUTINE W REFLEX MICROSCOPIC
Bilirubin Urine: NEGATIVE
Glucose, UA: NEGATIVE mg/dL
Hgb urine dipstick: NEGATIVE
Ketones, ur: NEGATIVE mg/dL
Leukocytes,Ua: NEGATIVE
Nitrite: NEGATIVE
Protein, ur: NEGATIVE mg/dL
Specific Gravity, Urine: 1.023 (ref 1.005–1.030)
pH: 6 (ref 5.0–8.0)

## 2023-08-22 LAB — POCT URINE DRUG SCREEN - MANUAL ENTRY (I-SCREEN)
POC Amphetamine UR: NOT DETECTED
POC Buprenorphine (BUP): NOT DETECTED
POC Cocaine UR: NOT DETECTED
POC Marijuana UR: NOT DETECTED
POC Methadone UR: NOT DETECTED
POC Methamphetamine UR: NOT DETECTED
POC Morphine: NOT DETECTED
POC Oxazepam (BZO): NOT DETECTED
POC Oxycodone UR: NOT DETECTED
POC Secobarbital (BAR): NOT DETECTED

## 2023-08-22 LAB — HEMOGLOBIN A1C
Hgb A1c MFr Bld: 4.9 % (ref 4.8–5.6)
Mean Plasma Glucose: 93.93 mg/dL

## 2023-08-22 LAB — LIPID PANEL
Cholesterol: 212 mg/dL — ABNORMAL HIGH (ref 0–200)
HDL: 84 mg/dL (ref >40)
LDL Cholesterol: 105 mg/dL — ABNORMAL HIGH (ref 0–99)
Total CHOL/HDL Ratio: 2.5 {ratio}
Triglycerides: 115 mg/dL (ref <150)
VLDL: 23 mg/dL (ref 0–40)

## 2023-08-22 LAB — ETHANOL: Alcohol, Ethyl (B): 70 mg/dL — ABNORMAL HIGH (ref <10)

## 2023-08-22 LAB — TSH: TSH: 0.257 u[IU]/mL — ABNORMAL LOW (ref 0.350–4.500)

## 2023-08-22 LAB — MAGNESIUM: Magnesium: 2 mg/dL (ref 1.7–2.4)

## 2023-08-22 MED ORDER — THIAMINE HCL 100 MG/ML IJ SOLN
100.0000 mg | Freq: Once | INTRAMUSCULAR | Status: AC
  Administered 2023-08-22: 100 mg via INTRAMUSCULAR
  Filled 2023-08-22: qty 2

## 2023-08-22 MED ORDER — LORAZEPAM 1 MG PO TABS: 1.0000 mg | ORAL_TABLET | Freq: Two times a day (BID) | ORAL | Status: DC

## 2023-08-22 MED ORDER — ADULT MULTIVITAMIN W/MINERALS CH
1.0000 | ORAL_TABLET | Freq: Every day | ORAL | Status: DC
  Administered 2023-08-22: 1 via ORAL
  Filled 2023-08-22: qty 1

## 2023-08-22 MED ORDER — LOPERAMIDE HCL 2 MG PO CAPS: 2.0000 mg | ORAL_CAPSULE | ORAL | Status: DC | PRN

## 2023-08-22 MED ORDER — LORAZEPAM 1 MG PO TABS: 1.0000 mg | ORAL_TABLET | Freq: Three times a day (TID) | ORAL | Status: DC

## 2023-08-22 MED ORDER — HYDROXYZINE HCL 25 MG PO TABS: 25.0000 mg | ORAL_TABLET | Freq: Four times a day (QID) | ORAL | Status: DC | PRN

## 2023-08-22 MED ORDER — LORAZEPAM 1 MG PO TABS: 1.0000 mg | ORAL_TABLET | Freq: Four times a day (QID) | ORAL | Status: DC | PRN

## 2023-08-22 MED ORDER — ALUM & MAG HYDROXIDE-SIMETH 200-200-20 MG/5ML PO SUSP: 30.0000 mL | ORAL | Status: DC | PRN

## 2023-08-22 MED ORDER — MAGNESIUM HYDROXIDE 400 MG/5ML PO SUSP: 30.0000 mL | Freq: Every day | ORAL | Status: DC | PRN

## 2023-08-22 MED ORDER — THIAMINE MONONITRATE 100 MG PO TABS: 100.0000 mg | ORAL_TABLET | Freq: Every day | ORAL | Status: DC

## 2023-08-22 MED ORDER — ONDANSETRON 4 MG PO TBDP: 4.0000 mg | ORAL_TABLET | Freq: Four times a day (QID) | ORAL | Status: DC | PRN

## 2023-08-22 MED ORDER — LORAZEPAM 1 MG PO TABS: 1.0000 mg | ORAL_TABLET | Freq: Every day | ORAL | Status: DC

## 2023-08-22 MED ORDER — ACETAMINOPHEN 325 MG PO TABS: 650.0000 mg | ORAL_TABLET | Freq: Four times a day (QID) | ORAL | Status: DC | PRN

## 2023-08-22 MED ORDER — LORAZEPAM 1 MG PO TABS
1.0000 mg | ORAL_TABLET | Freq: Four times a day (QID) | ORAL | Status: DC
  Administered 2023-08-22: 1 mg via ORAL
  Filled 2023-08-22: qty 1

## 2023-08-22 NOTE — ED Provider Notes (Signed)
[facility] Medical Screening Exam  Patient Name: Timothy Patrick MRN: [id number] Date of Evaluation: 08/22/23 Chief Complaint:   Diagnosis:  Final diagnoses:  Adjustment disorder with mixed anxiety and depressed mood  Alcohol abuse  Grief    History of Present illness: Timothy Patrick is a 32 y.o. male patient presented to [facility] as a walk in accompanied by his fiance with complaints of worsening alcohol use and requesting assistance with detox  Timothy Patrick, 32 y.o., male patient seen face to face by this provider, chart reviewed, and consulted with Dr. Phineas Inches on 08/22/23.  On evaluation Timothy Patrick reports his drinking has increased since the death of his brother in 05-25-2023.  "I guess it's my way of coping."  Patient states for the last several months he has been drinking at least 3 to 4 24 ounce beers and a fifth to a pint of liquor daily.  He states prior to the death of his brother he was drinking a couple times a week but it was more social.  Patient denies prior psychiatric history and states he has never had detox or rehab.  He states the longest period he has been without drinking in the last few months was a week and reports "seating, shaking, and nausea" when he doesn't drink.  Patient denies suicidal/self-harm/homicidal ideation, psychosis, and paranoia.  Denies history of seizures related to and unrelated to alcohol use.  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  He states he doesn't drink while working.  Patient lives in [place] with his fiance Karel Jarvis).    During evaluation Timothy Patrick is seated in exam room, dressed appropriate for weather with no noted distress.  He is alert/oriented x 4, calm, cooperative, attentive, and responses were relevant and appropriate to assessment questions.  He spoke in a clear tone at moderate volume, and normal pace, with good eye  contact.   He denies suicidal/self-harm/homicidal ideation, psychosis, and paranoia.  Objectively there is no evidence of psychosis/mania or delusional thinking.  He conversed coherently, with goal directed thoughts, no distractibility, or pre-occupation.  He reports he had only drank one beer today.  Recommended admission to [facility] to assist with alcohol detox.     Flowsheet Row ED from 08/22/2023 in [facility]  C-SSRS RISK CATEGORY No Risk       Psychiatric Specialty Exam  Presentation  General Appearance:Appropriate for Environment  Eye Contact:Good  Speech:Clear and Coherent; Normal Rate  Speech Volume:Normal  Handedness:Right   Mood and Affect  Mood: Anxious; Depressed  Affect: Depressed; Tearful; Congruent   Thought Process  Thought Processes: Coherent; Goal Directed  Descriptions of Associations:Intact  Orientation:Full (Time, Place and Person)  Thought Content:Logical    Hallucinations:None  Ideas of Reference:None  Suicidal Thoughts:No  Homicidal Thoughts:No   Sensorium  Memory: Immediate Good; Recent Good; Remote Good  Judgment: Intact  Insight: Present   Executive Functions  Concentration: Good  Attention Span: Good  Recall: Good  Fund of Knowledge: Good  Language: Good   Psychomotor Activity  Psychomotor Activity: Normal   Assets  Assets: Communication Skills; Desire for Improvement; Financial Resources/Insurance; Housing; Intimacy; Physical Health; Resilience; Social Support; Transportation   Sleep  Sleep: Good  Number of hours: No data recorded  Physical Exam: Physical Exam Vitals and nursing note reviewed. Chaperone present: Girlfriend present.  Constitutional:  General: He is not in acute distress.    Appearance: Normal appearance. He is not ill-appearing.  HENT:     Head: Normocephalic.  Cardiovascular:     Rate and Rhythm: Normal rate.  Pulmonary:     Effort:  Pulmonary effort is normal. No respiratory distress.  Musculoskeletal:        General: Normal range of motion.     Cervical back: Normal range of motion.  Skin:    General: Skin is warm and dry.  Neurological:     Mental Status: He is alert and oriented to person, place, and time.  Psychiatric:        Attention and Perception: Attention and perception normal. He does not perceive auditory or visual hallucinations.        Mood and Affect: Mood is anxious and depressed. Affect is tearful.        Speech: Speech normal.        Behavior: Behavior normal. Behavior is cooperative.        Thought Content: Thought content normal. Thought content is not paranoid or delusional. Thought content does not include homicidal or suicidal ideation.        Cognition and Memory: Cognition normal.    Review of Systems  Constitutional:        No other complaints voiced  Psychiatric/Behavioral:  Positive for depression. Substance abuse: Daily alcohol, reports he is drinking 3 to 4 24 ounce beers and a fith of liquor daily. Suicidal ideas: Denies.  All other systems reviewed and are negative.  Blood pressure 130/81, pulse 83, temperature 98.6 F (37 C), temperature source Oral, resp. rate 20, SpO2 98%. There is no height or weight on file to calculate BMI.  Musculoskeletal: Strength & Muscle Tone: within normal limits Gait & Station: normal Patient leans: N/A   [facility] MSE Discharge Disposition for Follow up and Recommendations: Based on my evaluation I certify that psychiatric inpatient services furnished can reasonably be expected to improve the patient's condition which I recommend transfer to an appropriate accepting facility. Patient to be transferred to [facility] once Lab results are back   Lab Orders         CBC with Differential/Platelet         Comprehensive metabolic panel         Hemoglobin A1c         Magnesium         Ethanol         Lipid panel         TSH         Prolactin          Urinalysis, Routine w reflex microscopic -Urine, Clean Catch         POCT Urine Drug Screen - (I-Screen)      Meds ordered this encounter  Medications   acetaminophen (TYLENOL) tablet 650 mg   alum & mag hydroxide-simeth (MAALOX/MYLANTA) 200-200-20 MG/5ML suspension 30 mL   magnesium hydroxide (MILK OF MAGNESIA) suspension 30 mL   thiamine (VITAMIN B1) injection 100 mg   thiamine (VITAMIN B1) tablet 100 mg   multivitamin with minerals tablet 1 tablet   LORazepam (ATIVAN) tablet 1 mg   hydrOXYzine (ATARAX) tablet 25 mg   loperamide (IMODIUM) capsule 2-4 mg   ondansetron (ZOFRAN-ODT) disintegrating tablet 4 mg   FOLLOWED BY Linked Order Group    LORazepam (ATIVAN) tablet 1 mg    LORazepam (ATIVAN) tablet 1 mg    LORazepam (ATIVAN) tablet 1 mg  LORazepam (ATIVAN) tablet 1 mg     Denvil Canning, NP 08/22/2023, 6:50 PM

## 2023-08-22 NOTE — ED Notes (Signed)
Pt is awake laying on recliner awake messing with his hair he is calm and cooperative he is distant from staff will continue to monitor for safety

## 2023-08-22 NOTE — BH Assessment (Addendum)
Comprehensive Clinical Assessment (CCA) Note  08/22/2023 Timothy Patrick [id number]  Disposition: Per Assunta Found, NP patient is recommended for admission to the [facility].   Chief Complaint:  Chief Complaint  Patient presents with   Alcohol Problem   Visit Diagnosis:  Depressive Disorder, Moderate Substance Induced Mood Disorder Alcohol Use Disorder, Severe   Timothy L. 17, a 32 year old male, presented to [facility] as a walk-in, accompanied by his fiance, Ebony. He is seeking assistance with detox due to increasing alcohol use, which he reports has worsened since the death of his brother in 05/18/23. Timothy Patrick expressed that his drinking has become a means of coping with his grief, stating, "I guess it's my way of coping."  Over the past several months, Timothy Patrick has been consuming 3 to 4 24-ounce beers and a fifth to a pint of liquor on a daily basis. He describes his alcohol use prior to his brother's death as more social, drinking only a couple of times per week. However, since the passing, his consumption has steadily increased. He denies any prior psychiatric history and has never undergone detoxification or rehabilitation. The longest period he has abstained from alcohol in recent months was approximately one week, during which he experienced withdrawal symptoms, including excessive sweating, shaking, and nausea.  When questioned about his mental health, Timothy Patrick denied experiencing suicidal ideation, self-harming behaviors, homicidal thoughts, psychosis, or paranoia. He also denies any history of seizures related to alcohol use or otherwise.  In terms of employment, Timothy Patrick works in roadside assistance, helping people with car trouble. He emphasized that he does not consume alcohol while working. He currently resides in [place] with his fiance.  During the evaluation, Timothy Patrick was seated comfortably in the exam room, appropriately dressed for the weather, and displayed no  visible signs of physical distress. He was alert and fully oriented to person, place, time, and situation. His behavior was calm, cooperative, and engaged, and he responded appropriately to all questions. His speech was clear, at a moderate volume and normal pace, and he maintained consistent eye contact throughout the interaction.  Timothy Patrick denied having any suicidal or homicidal ideation, hallucinations, delusions, or paranoid thoughts. There was no objective evidence of psychosis, mania, or delusional thinking during the assessment. His thought processes were coherent and goal-directed, without signs of distractibility or preoccupation. He reported that he had only consumed one beer today, which appeared consistent with his presentation.  Given the patient's reported increase in alcohol consumption, history of withdrawal symptoms, and lack of prior detox or rehabilitation, it was recommended by Assunta Found, NP, that [name] be admitted to a facility specializing in alcohol detoxification [facility]) to safely manage his withdrawal and begin the recovery process. Ongoing monitoring and support will be necessary to ensure his well-being and to address any potential complications related to alcohol withdrawal. Further evaluation and treatment options, including long-term rehabilitation or therapy, may be considered following his detoxification.  CCA Screening, Triage and Referral (STR)  Patient Reported Information How did you hear about [place]? Family/Friend  What Is the Reason for Your Visit/Call Today? Pt presents to [facility] voluntarily accompanied by his fiance' seeking alcohol use treatment and detox. Pt reports drinking daily, a bottle of liquor and four 24oz beers. Pt states his last drink was earlier today, one 24oz beer. Pt reports hx of mild withdrawal symptoms including increased agitation, shaking, and sweating but denies at the moment. Pt previously diagnosed with ADHD but is not currently  prescribed medication. Pt denies SI/HI and AVH.  How Long Has This Been Causing You Problems? > than 6 months  What Do You Feel Would Help You the Most Today? Treatment for Depression or other mood problem; Medication(s); Stress Management   Have You Recently Had Any Thoughts About Hurting Yourself? No  Are You Planning to Commit Suicide/Harm Yourself At This time? No   Flowsheet Row ED from 08/22/2023 in [facility]  C-SSRS RISK CATEGORY No Risk       Have you Recently Had Thoughts About Hurting Someone Timothy Patrick? No  Are You Planning to Harm Someone at This Time? No  Explanation: Patient denies.   Have You Used Any Alcohol or Drugs in the Past 24 Hours? Yes  What Did You Use and How Much? Alcohol (24 ounce can of beer)   Do You Currently Have a Therapist/Psychiatrist? No  Name of Therapist/Psychiatrist: Name of Therapist/Psychiatrist: No therapist or psychiatrist.   Have You Been Recently Discharged From Any Office Practice or Programs? No  Explanation of Discharge From Practice/Program: No     CCA Screening Triage Referral Assessment Type of Contact: Face-to-Face  Telemedicine Service Delivery: Telemedicine service delivery: -- (n/a)  Is this Initial or Reassessment? Is this Initial or Reassessment?: -- (n/a)  Date Telepsych consult ordered in [facility]:  Date Telepsych consult ordered in [facility]:  (n/a)  Time Telepsych consult ordered in [facility]:  Time Telepsych consult ordered in [facility]: 0000 (n/a)  Location of Assessment: [facility] Services  Provider Location: [facility] Assessment Services   Collateral Involvement: No collateral information at this time.   Does Patient Have a Automotive engineer Guardian? No  Legal Guardian Contact Information: No Legal guardian.  Copy of Legal Guardianship Form: No - copy requested  Legal Guardian Notified of Arrival: -- (n/a)  Legal Guardian Notified of Pending Discharge: -- (n/a)  If Minor  and Not Living with Parent(s), Who has Custody? n/a  Is CPS involved or ever been involved? Never  Is APS involved or ever been involved? Never   Patient Determined To Be At Risk for Harm To Self or Others Based on Review of Patient Reported Information or Presenting Complaint? No  Method: No Plan  Availability of Means: No access or NA  Intent: Vague intent or NA  Notification Required: No need or identified person  Additional Information for Danger to Others Potential: -- (n/a)  Additional Comments for Danger to Others Potential: n/a  Are There Guns or Other Weapons in Your Home? No  Types of Guns/Weapons: no weapons or guns. no firearms.  Are These Weapons Safely Secured?                            No  Who Could Verify You Are Able To Have These Secured: n/a  Do You Have any Outstanding Charges, Pending Court Dates, Parole/Probation? No legal issues.  Contacted To Inform of Risk of Harm To Self or Others: No data recorded   Does Patient Present under Involuntary Commitment? No    [place] of Residence: [place]   Patient Currently Receiving the Following Services: -- (Patient has no services in place at this time. No outpatient services (no therapist or psychiatrist).)   Determination of Need: Urgent (48 hours)   Options For Referral: Medication Management; Inpatient Hospitalization; Facility-Based Crisis     CCA Biopsychosocial Patient Reported Schizophrenia/Schizoaffective Diagnosis in Past: No   Strengths: Patient is willing to  seek treatment. He is engaged, cooperative.   Mental Health Symptoms Depression:   Difficulty Concentrating; Change in energy/activity; Fatigue; Hopelessness; Irritability   Duration of Depressive symptoms:  Duration of Depressive Symptoms: Greater than two weeks   Mania:   None   Anxiety:    Fatigue   Psychosis:   Grossly disorganized or catatonic behavior   Duration of Psychotic symptoms:  Duration of Psychotic  Symptoms: N/A   Trauma:   None   Obsessions:   None   Compulsions:   None   Inattention:   None   Hyperactivity/Impulsivity:   None   Oppositional/Defiant Behaviors:   None   Emotional Irregularity:   None   Other Mood/Personality Symptoms:   Patient is calm and cooperative.    Mental Status Exam Appearance and self-care  Stature:   Average   Weight:  Normal  Clothing:   Neat/clean   Grooming:   Normal   Cosmetic use:   Age appropriate   Posture/gait:   Normal   Motor activity:   Not Remarkable   Sensorium  Attention:  No data recorded  Concentration:   Normal   Orientation:   Time; Situation; Place; Person; Object   Recall/memory:   Normal   Affect and Mood  Affect:   Appropriate   Mood:   Anxious; Depressed   Relating  Eye contact:   Normal   Facial expression:   Depressed   Attitude toward examiner:   Cooperative   Thought and Language  Speech flow:  Clear and Coherent   Thought content:   Appropriate to Mood and Circumstances   Preoccupation:   None   Hallucinations:   None   Organization:   Coherent   [employer] of Knowledge:   Fair   Intelligence:   Average   Abstraction:   Normal   Judgement:   Normal   Reality Testing:   Adequate   Insight:   Fair   Decision Making:   Normal   Social Functioning  Social Maturity:   Irresponsible   Social Judgement:   Heedless   Stress  Stressors:   Relationship; Financial; Family conflict   Coping Ability:   Exhausted   Skill Deficits:   Activities of daily living   Supports:   Family; Other (Comment) (Fiance ([place]bone))     Religion: Religion/Spirituality Are You A Religious Person?: No How Might This Affect Treatment?: n/a  Leisure/Recreation: Leisure / Recreation Do You Have Hobbies?: No  Exercise/Diet: Exercise/Diet Do You Exercise?: No Have You Gained or Lost A Significant Amount of Weight in the Past Six  Months?: No Do You Follow a Special Diet?: No Do You Have Any Trouble Sleeping?: No   CCA Employment/Education Employment/Work Situation: Employment / Work Situation Employment Situation: Employed Work Stressors: Employed as Tax adviser. No work issues reported. Patient's Job has Been Impacted by Current Illness: No Has Patient ever Been in the [employer]?: No  Education: Education Is Patient Currently Attending School?: No Last Grade Completed:  (n/a) Did You Attend College?: No Did You Have An Individualized Education Program (IIEP): No Did You Have Any Difficulty At School?: No Patient's Education Has Been Impacted by Current Illness: No   CCA Family/Childhood History Family and Relationship History: Family history Marital status: Single Does patient have children?: Yes How many children?:  (6) How is patient's relationship with their children?: Patient has 2 of children on the week days and 4 on the weekends.  Childhood History:  Childhood History  By whom was/is the patient raised?: Mother Did patient suffer any verbal/emotional/physical/sexual abuse as a child?: No Did patient suffer from severe childhood neglect?: No Has patient ever been sexually abused/assaulted/raped as an adolescent or adult?: No Was the patient ever a victim of a crime or a disaster?: No Witnessed domestic violence?: No Has patient been affected by domestic violence as an adult?: No       CCA Substance Use Alcohol/Drug Use: Alcohol / Drug Use Pain Medications: SEE MAR Prescriptions: SEE MAR Over the Counter: SEE MAR History of alcohol / drug use?: Yes Longest period of sobriety (when/how long): n/a Negative Consequences of Use: Personal relationships Withdrawal Symptoms: Tremors, Sweats Substance #1 Name of Substance 1: Alcohol 1 - Age of First Use: 33 years old 1 - Amount (size/oz): #4-5 (24 ounce cans of beer) and 1/2 bottle of liquor 1 - Frequency: daily 1 - Duration:  on-going 1 - Last Use / Amount: this morning (24 ounce can of beer) 1 - Method of Aquiring: on-going 1- Route of Use: oral                       ASAM's:  Six Dimensions of Multidimensional Assessment  Dimension 1:  Acute Intoxication and/or Withdrawal Potential:      Dimension 2:  Biomedical Conditions and Complications:      Dimension 3:  Emotional, Behavioral, or Cognitive Conditions and Complications:     Dimension 4:  Readiness to Change:     Dimension 5:  Relapse, Continued use, or Continued Problem Potential:     Dimension 6:  Recovery/Living Environment:     ASAM Severity Score:    ASAM Recommended Level of Treatment:     Substance use Disorder (SUD) Substance Use Disorder (SUD)  Checklist Symptoms of Substance Use: Continued use despite having a persistent/recurrent physical/psychological problem caused/exacerbated by use, Continued use despite persistent or recurrent social, interpersonal problems, caused or exacerbated by use, Evidence of withdrawal (Comment), Evidence of tolerance, Large amounts of time spent to obtain, use or recover from the substance(s), Persistent desire or unsuccessful efforts to cut down or control use, Repeated use in physically hazardous situations, Substance(s) often taken in larger amounts or over longer times than was intended, Recurrent use that results in a failure to fulfill major role obligations (work, school, home), Social, occupational, recreational activities given up or reduced due to use, Presence of craving or strong urge to use  Recommendations for Services/Supports/Treatments: Recommendations for Services/Supports/Treatments Recommendations For Services/Supports/Treatments: Insurance claims handler, Individual Therapy, CD-IOP Intensive Chemical Dependency Program, SAIOP (Substance Abuse Intensive Outpatient Program)  Discharge Disposition:    DSM5 Diagnoses: Patient Active Problem List   Diagnosis Date Noted   Grief  08/22/2023   Alcohol abuse 08/22/2023   Adjustment disorder with mixed anxiety and depressed mood 08/22/2023     Referrals to Alternative Service(s): Referred to Alternative Service(s):   Place:   Date:   Time:    Referred to Alternative Service(s):   Place:   Date:   Time:    Referred to Alternative Service(s):   Place:   Date:   Time:    Referred to Alternative Service(s):   Place:   Date:   Time:     Melynda Ripple, Counselor

## 2023-08-22 NOTE — Discharge Instructions (Signed)
Patient to be transferred to [facility] once lab results are in for continuation of Care

## 2023-08-22 NOTE — ED Notes (Signed)
Pt for [facility], pending lab results.  Pt A&O x 4, no distress noted, calm & cooperative.  Monitoring for safety.

## 2023-08-22 NOTE — ED Notes (Signed)
As Per NP Shuvon Rankin, pt pending [facility] once lab results are back.

## 2023-08-22 NOTE — Progress Notes (Signed)
   08/22/23 1755  [facility] Triage Screening (Walk-ins at [facility] only)  How Did You Hear About [place]? Family/Friend  What Is the Reason for Your Visit/Call Today? Pt presents to [facility] voluntarily accompanied by his fiance' seeking alcohol use treatment and detox. Pt reports drinking daily, a bottle of liquor and four 24oz beers. Pt states his last drink was earlier today, one 24oz beer. Pt reports hx of mild withdrawal symptoms including increased agitation, shaking, and sweating but denies at the moment. Pt previously diagnosed with ADHD but is not currently prescribed medication. Pt denies SI/HI and AVH.  How Long Has This Been Causing You Problems? > than 6 months  Have You Recently Had Any Thoughts About Hurting Yourself? No  Are You Planning to Commit Suicide/Harm Yourself At This time? No  Have you Recently Had Thoughts About Hurting Someone [name]? No  Are You Planning To Harm Someone At This Time? No  Are you currently experiencing any auditory, visual or other hallucinations? No  Have You Used Any Alcohol or Drugs in the Past 24 Hours? Yes  How long ago did you use Drugs or Alcohol? today  What Did You Use and How Much? one 24oz beer  Do you have any current medical co-morbidities that require immediate attention? No  Clinician description of patient physical appearance/behavior: tearful, anxious, guarded  What Do You Feel Would Help You the Most Today? Alcohol or Drug Use Treatment  If access to [facility] was not available, would you have sought care in the Emergency Department? No  Determination of Need Urgent (48 hours)  Options For Referral Facility-Based Crisis

## 2023-08-23 ENCOUNTER — Other Ambulatory Visit (HOSPITAL_COMMUNITY)
Admission: EM | Admit: 2023-08-23 | Discharge: 2023-08-24 | Disposition: A | Discharge status: Home / Self Care | Payer: Medicaid Other | Source: Home / Self Care | Admitting: Registered Nurse

## 2023-08-23 DIAGNOSIS — Z634 Disappearance and death of family member: Secondary | ICD-10-CM | POA: Diagnosis not present

## 2023-08-23 DIAGNOSIS — Y903 Blood alcohol level of 60-79 mg/100 ml: Secondary | ICD-10-CM | POA: Insufficient documentation

## 2023-08-23 DIAGNOSIS — F17200 Nicotine dependence, unspecified, uncomplicated: Secondary | ICD-10-CM

## 2023-08-23 DIAGNOSIS — F1020 Alcohol dependence, uncomplicated: Secondary | ICD-10-CM | POA: Insufficient documentation

## 2023-08-23 DIAGNOSIS — F17210 Nicotine dependence, cigarettes, uncomplicated: Secondary | ICD-10-CM | POA: Insufficient documentation

## 2023-08-23 DIAGNOSIS — F1010 Alcohol abuse, uncomplicated: Secondary | ICD-10-CM | POA: Diagnosis not present

## 2023-08-23 DIAGNOSIS — F4323 Adjustment disorder with mixed anxiety and depressed mood: Secondary | ICD-10-CM | POA: Diagnosis not present

## 2023-08-23 MED ORDER — NICOTINE 21 MG/24HR TD PT24: 21.0000 mg | MEDICATED_PATCH | Freq: Every day | TRANSDERMAL | Status: DC | PRN

## 2023-08-23 MED ORDER — LORAZEPAM 1 MG PO TABS: 1.0000 mg | ORAL_TABLET | Freq: Two times a day (BID) | ORAL | Status: DC

## 2023-08-23 MED ORDER — GABAPENTIN 100 MG PO CAPS
100.0000 mg | ORAL_CAPSULE | Freq: Three times a day (TID) | ORAL | Status: DC
  Administered 2023-08-23 – 2023-08-24 (×3): 100 mg via ORAL
  Filled 2023-08-23 (×3): qty 1

## 2023-08-23 MED ORDER — CHLORDIAZEPOXIDE HCL 25 MG PO CAPS
25.0000 mg | ORAL_CAPSULE | Freq: Three times a day (TID) | ORAL | Status: DC
  Administered 2023-08-24: 25 mg via ORAL
  Filled 2023-08-23: qty 1

## 2023-08-23 MED ORDER — ACETAMINOPHEN 325 MG PO TABS: 650.0000 mg | ORAL_TABLET | Freq: Four times a day (QID) | ORAL | Status: DC | PRN

## 2023-08-23 MED ORDER — ONDANSETRON 4 MG PO TBDP: 4.0000 mg | ORAL_TABLET | Freq: Four times a day (QID) | ORAL | Status: DC | PRN

## 2023-08-23 MED ORDER — MAGNESIUM HYDROXIDE 400 MG/5ML PO SUSP: 30.0000 mL | Freq: Every day | ORAL | Status: DC | PRN

## 2023-08-23 MED ORDER — CHLORDIAZEPOXIDE HCL 25 MG PO CAPS
25.0000 mg | ORAL_CAPSULE | Freq: Four times a day (QID) | ORAL | Status: AC
  Administered 2023-08-23 (×3): 25 mg via ORAL
  Filled 2023-08-23 (×3): qty 1

## 2023-08-23 MED ORDER — LORAZEPAM 1 MG PO TABS: 1.0000 mg | ORAL_TABLET | Freq: Every day | ORAL | Status: DC

## 2023-08-23 MED ORDER — LOPERAMIDE HCL 2 MG PO CAPS: 2.0000 mg | ORAL_CAPSULE | ORAL | Status: DC | PRN

## 2023-08-23 MED ORDER — ALUM & MAG HYDROXIDE-SIMETH 200-200-20 MG/5ML PO SUSP: 30.0000 mL | ORAL | Status: DC | PRN

## 2023-08-23 MED ORDER — ADULT MULTIVITAMIN W/MINERALS CH
1.0000 | ORAL_TABLET | Freq: Every day | ORAL | Status: DC
  Administered 2023-08-23 – 2023-08-24 (×2): 1 via ORAL
  Filled 2023-08-23 (×2): qty 1

## 2023-08-23 MED ORDER — THIAMINE HCL 100 MG/ML IJ SOLN: 100.0000 mg | Freq: Once | INTRAMUSCULAR | Status: DC

## 2023-08-23 MED ORDER — LORAZEPAM 1 MG PO TABS: 1.0000 mg | ORAL_TABLET | Freq: Four times a day (QID) | ORAL | Status: DC | PRN

## 2023-08-23 MED ORDER — LORAZEPAM 1 MG PO TABS
1.0000 mg | ORAL_TABLET | Freq: Four times a day (QID) | ORAL | Status: DC
  Administered 2023-08-23: 1 mg via ORAL
  Filled 2023-08-23: qty 1

## 2023-08-23 MED ORDER — HYDROXYZINE HCL 25 MG PO TABS
25.0000 mg | ORAL_TABLET | Freq: Four times a day (QID) | ORAL | Status: DC | PRN
  Administered 2023-08-23: 25 mg via ORAL
  Filled 2023-08-23: qty 1

## 2023-08-23 MED ORDER — THIAMINE MONONITRATE 100 MG PO TABS
100.0000 mg | ORAL_TABLET | Freq: Every day | ORAL | Status: DC
  Administered 2023-08-23 – 2023-08-24 (×2): 100 mg via ORAL
  Filled 2023-08-23 (×2): qty 1

## 2023-08-23 MED ORDER — CHLORDIAZEPOXIDE HCL 25 MG PO CAPS: 25.0000 mg | ORAL_CAPSULE | Freq: Every day | ORAL | Status: DC

## 2023-08-23 MED ORDER — LORAZEPAM 1 MG PO TABS: 1.0000 mg | ORAL_TABLET | Freq: Three times a day (TID) | ORAL | Status: DC

## 2023-08-23 MED ORDER — CHLORDIAZEPOXIDE HCL 25 MG PO CAPS: 25.0000 mg | ORAL_CAPSULE | ORAL | Status: DC

## 2023-08-23 NOTE — ED Notes (Signed)
Report given to RN Riley Lam on [facility]

## 2023-08-23 NOTE — ED Notes (Signed)
New pt admitted through [facility] to [facility] requesting for alcohol use treatment and detox. On arrival, A/O X4 MAE. Calm and cooperative during the admission process. Patient oriented to the unit and unit rules. Verablized understanding. Skin assessment check was unremarkable. NAD. Denies SI/HI & AVH. Will continue to monitor for safety.
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
## 2023-08-23 NOTE — Discharge Instructions (Signed)
[facility] [address] [place], [place], [address] [phone] phone  New Patient Assessment/Therapy Walk-Ins:  Monday and Wednesday: 8 am until slots are full. Every 1st and 2nd Fridays of the month: 1 pm - 5 pm.  NO ASSESSMENT/THERAPY WALK-INS ON TUESDAYS OR THURSDAYS  New Patient Assessment/Medication Management Walk-Ins:  Monday - Friday:  8 am - 11 am.  For all walk-ins, we ask that you arrive by 7:30 am because patients will be seen in the order of arrival.  Availability is limited; therefore, you may not be seen on the same day that you walk-in.  Our goal is to serve and meet the needs of our community to the best of our ability.  SUBSTANCE USE TREATMENT for Medicaid and State Funded/IPRS  [facility] [address], [place], [address] [phone] phone NOTE: [facility] is no longer offering IOP services.  Serves those who are low-income or have no insurance.  [facility] [address], [place], [place], [address] [phone] phone [phone] fax NOTE: Does have Substance Abuse-Intensive Outpatient Program (SAIOP) as well as transitional housing if eligible.  [facility] [address] [place], [place], [address] [phone] phone [phone] fax  [facility] [address] [place], [place], [address] [phone] phone [phone] fax  HALFWAY HOUSES:  [facility] [place] [phone]  [facility] www.oxfordvacancies.com  12 STEP PROGRAMS:  Alcoholics Anonymous of [place] https://aagreensboronc.com/meeting  Narcotics Anonymous of [place] https://greensborona.org/meetings/  Al-Anon of [place], [place] www.greens[place]alanon.org/find-meetings.html  Nar-Anon https://nar-anon.org/find-a-meetin  List of Residential placements:   [facility] in [place]: [phone]  [facility]: [phone]  [facility]: [place], [place]  [phone]: Male and male facility; 30-day program: (uninsured and Medicaid such as [facility], [facility], [facility], partners)  [facility]: [phone]; men and women's facility; 28 days; Can have Medicaid tailored plan ([facility] or [facility])  [facility]: [phone] [place] or [place]; 28 day program; must be fully detox; tailored Medicaid or no insurance  [facility] in [place], [place]; [phone]; 28 day all males program; no insurance accepted  [facility] Referral in [place]: [place] [phone] (no insurance or Medicaid only); 90 days; outpatient services but provide housing in apartments [place]  [facility] Admission: [phone]: Patient must complete phone screening for placement: [place], [place]; 6 month program; uninsured, Medicaid, and [facility] insurance.   [facility]: no insurance required; [phone]  [place] [facility]: [phone]; Intake: Robert; Must fill out application online; Victor Delay [phone] x [phone]  [facility] in [place], [place]: [phone]; Admissions Coordinators Mr. Dennis or David Gibson; 90 day program.  [facility]: [place], [place] [phone]; Co-Ed 9 month to a year program; Online application; Men entry fee is $500 (6-12months);  [facility]: [address] [place], [place] [address]; no fee or insurance required; minimum of 2 years; Highly structured; work based; Intake Coordinator is Chris [phone]  [facility] in [place], [place]: [phone]; Fax number is [phone]; website: www.[facility].org; Requires 3-6 page autobiography; 2 year program (18 months and then 6month transitional housing); Admission fee is $300; no insurance needed; work program  [facility] in [place], [place]: Front Desk Staff: Reeci [phone]: They have a Men's Regenerations Program 6-9months. Free program; There is an initial $300 fee however, they are willing to work  with patients regarding that. Application is online.  [facility]: Admissions [phone] Benjamin Cox ext [phone]; Any 7-90 day program is out of pocket; 12   month program is free of charge; there is a $275 entry fee; Patient is responsible for own transportation 

## 2023-08-23 NOTE — ED Notes (Signed)
Patient observed/assessed in room in bed appearing in no immediate distress resting peacefully. Q15 minute checks continued by MHT and nursing staff. Will continue to monitor and support. 

## 2023-08-23 NOTE — ED Notes (Signed)
Snacks given. Patient verbalized no complaints at this time and is participating in group activity in day room.

## 2023-08-23 NOTE — Group Note (Signed)
Group Topic: Communication  Group Date: 08/23/2023 Start Time: 1715 End Time: 1812 Facilitators: Vonzell Schlatter B  Department: [facility]  Number of Participants: 3  Group Focus: communication and daily focus Treatment Modality:  Psychoeducation Interventions utilized were mental fitness and support Purpose: relapse prevention strategies and trigger / craving management  Name: Timothy Patrick Date of Birth: 01-21-1991  MR: [id number]    Level of Participation: active Quality of Participation: attentive and cooperative Interactions with others: gave feedback Mood/Affect: positive Triggers (if applicable): n/a Cognition: coherent/clear Progress: Moderate Response: n/a Plan: follow-up needed  Patients Problems:  Patient Active Problem List   Diagnosis Date Noted   Alcohol use disorder, severe, in controlled environment, dependence (HCC) 08/23/2023   Grief 08/22/2023   Alcohol abuse 08/22/2023   Adjustment disorder with mixed anxiety and depressed mood 08/22/2023

## 2023-08-23 NOTE — ED Notes (Signed)
Patient is in the bedroom sleeping.NAD.  Respirations are even and unlabored.  Will continue to monitor for safety.

## 2023-08-23 NOTE — ED Notes (Signed)
Pt is in the dayroom watching TV with peers. Pt denies SI/HI/AVH. No acute distress noted. Will continue to monitor for safety. 

## 2023-08-23 NOTE — Progress Notes (Signed)
Pt was visible in the milieu however was isolative and did not interact with peers. No distress noted or concerns voiced. Staff will monitor for pt's safety.

## 2023-08-23 NOTE — Progress Notes (Signed)
Pt is awake, alert and oriented X3. Pt did not voice any complaints of pain or discomfort. No signs of acute distress noted. Administered scheduled meds per order. Pt denies current SI/HI/AVH, plan or intent. Staff will monitor for pt's safety.
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
## 2023-08-24 DIAGNOSIS — F1010 Alcohol abuse, uncomplicated: Secondary | ICD-10-CM | POA: Diagnosis not present

## 2023-08-24 DIAGNOSIS — Z634 Disappearance and death of family member: Secondary | ICD-10-CM | POA: Diagnosis not present

## 2023-08-24 DIAGNOSIS — F4323 Adjustment disorder with mixed anxiety and depressed mood: Secondary | ICD-10-CM | POA: Diagnosis not present

## 2023-08-24 LAB — VITAMIN D 25 HYDROXY (VIT D DEFICIENCY, FRACTURES): Vit D, 25-Hydroxy: 23.9 ng/mL — ABNORMAL LOW (ref 30–100)

## 2023-08-24 LAB — PROLACTIN: Prolactin: 3.6 ng/mL — ABNORMAL LOW (ref 3.9–22.7)

## 2023-08-24 LAB — TSH: TSH: 0.971 u[IU]/mL (ref 0.350–4.500)

## 2023-08-24 LAB — T4, FREE: Free T4: 0.78 ng/dL (ref 0.61–1.12)

## 2023-08-24 LAB — FERRITIN: Ferritin: 112 ng/mL (ref 24–336)

## 2023-08-24 MED ORDER — NICOTINE 21 MG/24HR TD PT24: 21.0000 mg | MEDICATED_PATCH | Freq: Every day | TRANSDERMAL | 0 refills | Status: AC | PRN

## 2023-08-24 MED ORDER — VITAMIN B-1 100 MG PO TABS: 100.0000 mg | ORAL_TABLET | Freq: Every day | ORAL | Status: AC

## 2023-08-24 MED ORDER — VITAMIN D (ERGOCALCIFEROL) 1.25 MG (50000 UNIT) PO CAPS: 50000.0000 [IU] | ORAL_CAPSULE | ORAL | Status: AC

## 2023-08-24 MED ORDER — GABAPENTIN 100 MG PO CAPS: 100.0000 mg | ORAL_CAPSULE | Freq: Three times a day (TID) | ORAL | 0 refills | Status: AC

## 2023-08-24 NOTE — ED Notes (Signed)
Patient observed in the Dayroom watching television. He is in no acute distress. Will continue to monitor for safety.

## 2023-08-24 NOTE — ED Notes (Signed)
Patient is sleeping. Respirations equal and unlabored, skin warm and dry. No change in assessment or acuity. Routine safety checks conducted according to facility protocol. Will continue to monitor for safety.   

## 2023-08-24 NOTE — Discharge Planning (Signed)
LCSW was informed by MD that patient would like to discharge on today due to "being bored". Patient reports he would like to be set up with Telehealth therapy and discharge back home with this fiance. MD will touch basis with fiance to safety plan. Resources have been added to the patient's AVS for his follow up. No other needs to report at this time. LCSW to sign off. Please inform if further LCSW needs arise prior to discharge.   Fernande Boyden, LCSW Clinical Social Worker [facility] Ph: [phone]

## 2023-08-24 NOTE — Group Note (Signed)
Group Topic: Change and Accountability  Group Date: 08/24/2023 Start Time: 1930 End Time: 2000 Facilitators: Hilma Favors, RN  Department: [facility]  Number of Participants: 2  Group Focus: personal responsibility Treatment Modality:  Behavior Modification Therapy Interventions utilized were support Purpose: relapse prevention strategies  Name: GIORGIO CHABOT Date of Birth: [birth year]  MR: [id number]    Level of Participation: active Quality of Participation: cooperative Interactions with others: gave feedback Mood/Affect: appropriate Triggers (if applicable):  Cognition: coherent/clear Progress: Minimal Response:  Plan: patient will be encouraged to attend groups  Patients Problems:  Patient Active Problem List   Diagnosis Date Noted   Alcohol use disorder, severe, in controlled environment, dependence (HCC) 08/23/2023   Grief 08/22/2023   Alcohol abuse 08/22/2023   Adjustment disorder with mixed anxiety and depressed mood 08/22/2023

## 2023-08-24 NOTE — ED Notes (Signed)
Patient alert and oriented X 3. Discharged in no acute distress.  AVS given and pt verbalized understanding of medications and follow-up. Patient escorted to lobby via staff for transport home by self. Safety maintained

## 2023-08-24 NOTE — ED Notes (Signed)
Patient A&Ox4. Denies intent to harm self/others when asked. Denies SI or A/VH. Patient denies any physical complaints. No acute distress observed. Routine safety checks conducted according to facility protocol. Patient agreed to notify staff if thoughts of harm toward self or others arise. Patient voiced wanting to leave today, states he doesn't want to talk about it but is willing to speak to the providers. Will continue to monitor for safety.

## 2023-08-24 NOTE — Group Note (Signed)
Group Topic: Understanding Self  Group Date: 08/24/2023 Start Time: 1000 End Time: 1015 Facilitators: Priscille Kluver, NT  Department: [facility]  Number of Participants: 1  Group Focus: coping skills Treatment Modality:  Solution-Focused Therapy Interventions utilized were story telling Purpose: express feelings  Name: Timothy Patrick Date of Birth: Jan 28, 1991  MR: [id number]    Level of Participation: active Quality of Participation: attentive Interactions with others: gave feedback Mood/Affect: positive Triggers (if applicable): Dealing with the death of his brother Cognition: coherent/clear Progress: Moderate Response: Wanting to get back to work and start healing. Pt wants to go to the gym and have positive coping skills.    Patients Problems:  Patient Active Problem List   Diagnosis Date Noted   Alcohol use disorder, severe, in controlled environment, dependence (HCC) 08/23/2023   Grief 08/22/2023   Alcohol abuse 08/22/2023   Adjustment disorder with mixed anxiety and depressed mood 08/22/2023
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
## 2023-09-28 ENCOUNTER — Ambulatory Visit (HOSPITAL_COMMUNITY): Payer: Medicaid Other | Admitting: Licensed Clinical Social Worker

## 2023-09-28 ENCOUNTER — Encounter (HOSPITAL_COMMUNITY): Payer: Self-pay
